# Patient Record
Sex: Female | Born: 1970 | Race: White | Hispanic: Yes | Marital: Married | Smoking: Former smoker
Health system: Southern US, Community
[De-identification: ages and names within clinical notes are randomized; demographics above are authoritative.]

## PROBLEM LIST (undated history)

## (undated) DIAGNOSIS — I10 Essential (primary) hypertension: Secondary | ICD-10-CM

## (undated) HISTORY — PX: ABDOMINAL HYSTERECTOMY: SHX81

## (undated) HISTORY — DX: Essential (primary) hypertension: I10

## (undated) HISTORY — PX: TOTAL ABDOMINAL HYSTERECTOMY: SHX209

---

## 2019-09-01 ENCOUNTER — Encounter: Payer: Self-pay | Admitting: Emergency Medicine

## 2019-09-01 ENCOUNTER — Other Ambulatory Visit: Payer: Self-pay

## 2019-09-01 ENCOUNTER — Ambulatory Visit
Admission: EM | Admit: 2019-09-01 | Discharge: 2019-09-01 | Disposition: A | Discharge status: Home / Self Care | Payer: BC Managed Care – PPO | Attending: Family Medicine | Admitting: Family Medicine

## 2019-09-01 DIAGNOSIS — Z87891 Personal history of nicotine dependence: Secondary | ICD-10-CM | POA: Diagnosis not present

## 2019-09-01 DIAGNOSIS — R0602 Shortness of breath: Secondary | ICD-10-CM | POA: Diagnosis not present

## 2019-09-01 DIAGNOSIS — M542 Cervicalgia: Secondary | ICD-10-CM | POA: Diagnosis not present

## 2019-09-01 DIAGNOSIS — R42 Dizziness and giddiness: Secondary | ICD-10-CM | POA: Diagnosis not present

## 2019-09-01 DIAGNOSIS — Z20822 Contact with and (suspected) exposure to covid-19: Secondary | ICD-10-CM | POA: Insufficient documentation

## 2019-09-01 DIAGNOSIS — R030 Elevated blood-pressure reading, without diagnosis of hypertension: Secondary | ICD-10-CM | POA: Diagnosis not present

## 2019-09-01 DIAGNOSIS — R519 Headache, unspecified: Secondary | ICD-10-CM | POA: Diagnosis not present

## 2019-09-01 NOTE — ED Triage Notes (Signed)
Patient in today c/o dizziness, headache, sob x 3 days. Patient thinks her blood pressure is high and causing these symptoms. Patient had BP checked at work on 08/29/19 and it 174/89. Patient does not have a history of HTN.

## 2019-09-01 NOTE — Discharge Instructions (Addendum)
You need more blood pressure readings to confirm hypertension.  Check your BP at home.  Call [facility] for an appt.  Take care  Dr. Adriana Simas

## 2019-09-01 NOTE — ED Provider Notes (Signed)
[facility]    CSN: [id number] Arrival date & time: 09/01/19  1340      History   Chief Complaint Chief Complaint  Patient presents with  . Dizziness  . Headache  . Shortness of Breath  . elevated blood pressure reading   HPI  49 year old female presents with multiple complaints.  Patient reports intermittent dizziness, headache, neck pain. Also reports "feeling hot". Patient recently checked BP and it was elevated (174/89). No history of hypertension. BP mildly elevated today.  No known inciting factor. No known exacerbating factors.   PMH, Surgical Hx, Family Hx, Social History reviewed and updated as below.  PMH: No significant PMH.  Past Surgical History:  Procedure Laterality Date  . ABDOMINAL HYSTERECTOMY      OB History   No obstetric history on file.      Home Medications    Prior to Admission medications   Medication Sig Start Date End Date Taking? Authorizing Provider  estradiol (ESTRACE) 2 MG tablet Take 2 mg by mouth daily. 08/31/19  Yes [provider]    Family History Family History  Problem Relation Age of Onset  . Sleep apnea Mother   . Hypertension Mother   . Hypertension Father   . Throat cancer Father     Social History Social History   Tobacco Use  . Smoking status: Former Smoker    Quit date: 10/30/2018    Years since quitting: 0.8  . Smokeless tobacco: Never Used  Substance Use Topics  . Alcohol use: Never  . Drug use: Never     Allergies   Morphine and related   Review of Systems Review of Systems Per HPI  Physical Exam Triage Vital Signs ED Triage Vitals  Enc Vitals Group     BP 09/01/19 1406 (!) 141/89     Pulse Rate 09/01/19 1406 75     Resp 09/01/19 1406 18     Temp 09/01/19 1406 98.5 F (36.9 C)     Temp Source 09/01/19 1406 Oral     SpO2 09/01/19 1406 99 %     Weight 09/01/19 1408 170 lb (77.1 kg)     Height 09/01/19 1408 5\' 5"  (1.651 m)     Head Circumference --      Peak Flow --       Pain Score 09/01/19 1406 8     Pain Loc --      Pain Edu? --      Excl. in [place]? --    Updated Vital Signs BP (!) 141/89 (BP Location: Left Arm)   Pulse 75   Temp 98.5 F (36.9 C) (Oral)   Resp 18   Ht 5\' 5"  (1.651 m)   Wt 77.1 kg   SpO2 99%   BMI 28.29 kg/m   Visual Acuity Right Eye Distance:   Left Eye Distance:   Bilateral Distance:    Right Eye Near:   Left Eye Near:    Bilateral Near:     Physical Exam Vitals and nursing note reviewed.  Constitutional:      General: She is not in acute distress.    Appearance: Normal appearance. She is not ill-appearing.  HENT:     Head: Normocephalic and atraumatic.  Eyes:     General:        Right eye: No discharge.        Left eye: No discharge.     Conjunctiva/sclera: Conjunctivae normal.  Cardiovascular:  Rate and Rhythm: Normal rate and regular rhythm.     Heart sounds: No murmur.  Pulmonary:     Effort: Pulmonary effort is normal.     Breath sounds: Normal breath sounds. No wheezing, rhonchi or rales.  Neurological:     Mental Status: She is alert.  Psychiatric:        Behavior: Behavior normal.     Comments: Anxious.     UC Treatments / Results  Labs (all labs ordered are listed, but only abnormal results are displayed) Labs Reviewed  NOVEL CORONAVIRUS, NAA (HOSP ORDER, SEND-OUT TO REF LAB; TAT 18-24 HRS)    EKG   Radiology No results found.  Procedures Procedures (including critical care time)  Medications Ordered in UC Medications - No data to display  Initial Impression / Assessment and Plan / UC Course  I have reviewed the triage vital signs and the nursing notes.  Pertinent labs & imaging results that were available during my care of the patient were reviewed by me and considered in my medical decision making (see chart for details).    49 year old female presents with multiple complaints.  She is well appearing with a normal exam. BP mildly elevated today. Cannot diagnose HTN with  additional BP readings. Advised to see [facility]/Primary care. Check BP at home. Supportive care.   Final Clinical Impressions(s) / UC Diagnoses   Final diagnoses:  Elevated BP without diagnosis of hypertension     Discharge Instructions     You need more blood pressure readings to confirm hypertension.  Check your BP at home.  Call [facility] for an appt.  Take care  Dr. Adriana Simas    ED Prescriptions    None     PDMP not reviewed this encounter.   Tommie Sams, [place] 09/01/19 2229

## 2019-09-03 LAB — NOVEL CORONAVIRUS, NAA (HOSP ORDER, SEND-OUT TO REF LAB; TAT 18-24 HRS): SARS-CoV-2, NAA: NOT DETECTED

## 2019-09-17 ENCOUNTER — Other Ambulatory Visit: Payer: Self-pay

## 2019-09-17 ENCOUNTER — Ambulatory Visit (INDEPENDENT_AMBULATORY_CARE_PROVIDER_SITE_OTHER): Payer: BC Managed Care – PPO | Admitting: Internal Medicine

## 2019-09-17 ENCOUNTER — Encounter: Payer: Self-pay | Admitting: Internal Medicine

## 2019-09-17 VITALS — BP 146/82 | HR 82 | Temp 98.4°F | Ht 65.0 in | Wt 175.0 lb

## 2019-09-17 DIAGNOSIS — I10 Essential (primary) hypertension: Secondary | ICD-10-CM | POA: Diagnosis not present

## 2019-09-17 DIAGNOSIS — Z23 Encounter for immunization: Secondary | ICD-10-CM | POA: Diagnosis not present

## 2019-09-17 DIAGNOSIS — M5431 Sciatica, right side: Secondary | ICD-10-CM | POA: Diagnosis not present

## 2019-09-17 DIAGNOSIS — E538 Deficiency of other specified B group vitamins: Secondary | ICD-10-CM

## 2019-09-17 DIAGNOSIS — E559 Vitamin D deficiency, unspecified: Secondary | ICD-10-CM | POA: Diagnosis not present

## 2019-09-17 DIAGNOSIS — R252 Cramp and spasm: Secondary | ICD-10-CM

## 2019-09-17 MED ORDER — GABAPENTIN 600 MG PO TABS: 600.0000 mg | ORAL_TABLET | Freq: Every day | ORAL | 2 refills | Status: DC

## 2019-09-17 MED ORDER — METAXALONE 800 MG PO TABS: 800.0000 mg | ORAL_TABLET | Freq: Three times a day (TID) | ORAL | 2 refills | Status: DC

## 2019-09-17 MED ORDER — DICLOFENAC SODIUM 75 MG PO TBEC: 75.0000 mg | DELAYED_RELEASE_TABLET | Freq: Two times a day (BID) | ORAL | 2 refills | Status: DC

## 2019-09-17 MED ORDER — LISINOPRIL 10 MG PO TABS: 10.0000 mg | ORAL_TABLET | Freq: Every day | ORAL | 3 refills | Status: DC

## 2019-09-17 NOTE — Progress Notes (Signed)
Date:  09/17/2019   Name:  Susan Durham   DOB:  08/24/1971   MRN:  [id number]  Interpreter services via video link. Chief Complaint: Establish Care (New pt. Flu shot.), Hypertension (Dizziness along with high bp. Brought readings today. ), and Back Pain (Sciatic nerve pain. Right side. Said she has had this for years. Sometimes runs down leg and cannot get up from bed. )  Hypertension This is a new problem. The problem is unchanged. The problem is uncontrolled. Associated symptoms include headaches. Pertinent negatives include no chest pain, orthopnea, palpitations, peripheral edema or shortness of breath. There are no associated agents to hypertension. Past treatments include nothing (did start a low sodium diet).  Back Pain This is a chronic problem. The problem occurs intermittently. The pain is present in the lumbar spine. The pain radiates to the right thigh. The pain is moderate. Associated symptoms include abdominal pain (intermittent upper pain), headaches and weakness (at times in right leg). Pertinent negatives include no chest pain, fever or numbness. She has tried NSAIDs and muscle relaxant (in [place], got relief with Voltaren, Skelaxin and Neurontin) for the symptoms.  Abdominal Cramping The current episode started more than 1 year ago. The problem occurs intermittently. The pain is located in the RUQ. The quality of the pain is cramping. The abdominal pain does not radiate. Associated symptoms include headaches. Pertinent negatives include no constipation, diarrhea, fever, myalgias, nausea or vomiting. Exacerbated by: started while she was pregnant. Relieved by: stretching and massage. She has tried nothing for the symptoms.  She also related a hx of low vitamin D and low B12 when in [place].  She has started taking supplements and her muscle and bone pain seems somewhat improved.   Review of Systems  Constitutional: Negative for chills, fatigue, fever and unexpected weight change.    Respiratory: Negative for shortness of breath and wheezing.   Cardiovascular: Negative for chest pain, palpitations and orthopnea.  Gastrointestinal: Positive for abdominal pain (intermittent upper pain). Negative for blood in stool, constipation, diarrhea, nausea and vomiting.  Musculoskeletal: Positive for back pain. Negative for joint swelling and myalgias.  Neurological: Positive for weakness (at times in right leg) and headaches. Negative for numbness.  Psychiatric/Behavioral: Negative for sleep disturbance.    There are no problems to display for this patient.   Allergies  Allergen Reactions  . Morphine And Related Anaphylaxis    Past Surgical History:  Procedure Laterality Date  . ABDOMINAL HYSTERECTOMY      Social History   Tobacco Use  . Smoking status: Former Smoker    Packs/day: 0.50    Years: 30.00    Pack years: 15.00    Types: Cigarettes    Quit date: 10/30/2018    Years since quitting: 0.8  . Smokeless tobacco: Never Used  Substance Use Topics  . Alcohol use: Never  . Drug use: Never     Medication list has been reviewed and updated.  Current Meds  Medication Sig  . cholecalciferol (VITAMIN D3) 25 MCG (1000 UNIT) tablet Take 1,000 Units by mouth daily.  Marland Kitchen estradiol (ESTRACE) 2 MG tablet Take 2 mg by mouth daily.  . vitamin B-12 (CYANOCOBALAMIN) 500 MCG tablet Take 500 mcg by mouth daily.    PHQ 2/9 Scores 09/17/2019  PHQ - 2 Score 0  PHQ- 9 Score 0    BP Readings from Last 3 Encounters:  09/17/19 (!) 146/82  09/01/19 (!) 141/89    Physical Exam Vitals and nursing note reviewed.  Constitutional:      General: She is not in acute distress.    Appearance: Normal appearance. She is well-developed.  HENT:     Head: Normocephalic and atraumatic.  Cardiovascular:     Rate and Rhythm: Normal rate and regular rhythm.     Pulses: Normal pulses.     Heart sounds: Normal heart sounds. No murmur.  Pulmonary:     Effort: Pulmonary effort is normal.  No respiratory distress.     Breath sounds: Normal breath sounds and air entry. No wheezing or rhonchi.  Musculoskeletal:        General: Normal range of motion.     Cervical back: Normal range of motion.     Lumbar back: Normal. No spasms, tenderness or bony tenderness. Normal range of motion. Negative right straight leg raise test and negative left straight leg raise test.     Right lower leg: No edema.     Left lower leg: No edema.  Lymphadenopathy:     Cervical: No cervical adenopathy.  Skin:    General: Skin is warm and dry.     Capillary Refill: Capillary refill takes less than 2 seconds.     Findings: No rash.  Neurological:     General: No focal deficit present.     Mental Status: She is alert and oriented to person, place, and time.     Sensory: Sensation is intact.     Motor: Motor function is intact.     Gait: Gait normal.     Deep Tendon Reflexes:     Reflex Scores:      Patellar reflexes are 2+ on the right side and 2+ on the left side. Psychiatric:        Attention and Perception: Attention normal.        Mood and Affect: Mood normal.        Behavior: Behavior normal.        Thought Content: Thought content normal.     Wt Readings from Last 3 Encounters:  09/17/19 175 lb (79.4 kg)  09/01/19 170 lb (77.1 kg)    BP (!) 146/82   Pulse 82   Temp 98.4 F (36.9 C) (Oral)   Ht 5\' 5"  (1.651 m)   Wt 175 lb (79.4 kg)   SpO2 97%   BMI 29.12 kg/m   Assessment and Plan: 1. Essential hypertension Needs to begin medication - Continue low salt diet, monitor BP at home twice a week Follow up in 6 weeks - lisinopril (ZESTRIL) 10 MG tablet; Take 1 tablet (10 mg total) by mouth daily.  Dispense: 90 tablet; Refill: 3 - CBC with Differential/Platelet - Comprehensive metabolic panel - TSH + free T4  2. Sciatica, right side Will resume previous regimen that provided benefit - diclofenac (VOLTAREN) 75 MG EC tablet; Take 1 tablet (75 mg total) by mouth 2 (two) times  daily.  Dispense: 60 tablet; Refill: 2 - gabapentin (NEURONTIN) 600 MG tablet; Take 1 tablet (600 mg total) by mouth at bedtime.  Dispense: 30 tablet; Refill: 2 - metaxalone (SKELAXIN) 800 MG tablet; Take 1 tablet (800 mg total) by mouth 3 (three) times daily.  Dispense: 90 tablet; Refill: 2  3. Vitamin D deficiency Supplemented; check levels - VITAMIN D 25 Hydroxy (Vit-D Deficiency, Fractures)  4. Vitamin B12 deficiency Supplemented, check levels - Vitamin B12  5. Muscle cramps In legs and abdomen intermittently Gabapentin may be helpful - Magnesium   Partially dictated using . Any errors are  unintentional.  Halina Maidens, MD [facility]  09/17/2019

## 2019-09-18 LAB — CBC WITH DIFFERENTIAL/PLATELET
Basophils Absolute: 0 10*3/uL (ref 0.0–0.2)
Basos: 1 %
EOS (ABSOLUTE): 0.2 10*3/uL (ref 0.0–0.4)
Eos: 3 %
Hematocrit: 38.7 % (ref 34.0–46.6)
Hemoglobin: 12.4 g/dL (ref 11.1–15.9)
Immature Grans (Abs): 0 10*3/uL (ref 0.0–0.1)
Immature Granulocytes: 0 %
Lymphocytes Absolute: 2 10*3/uL (ref 0.7–3.1)
Lymphs: 30 %
MCH: 28.4 pg (ref 26.6–33.0)
MCHC: 32 g/dL (ref 31.5–35.7)
MCV: 89 fL (ref 79–97)
Monocytes Absolute: 0.8 10*3/uL (ref 0.1–0.9)
Monocytes: 12 %
Neutrophils Absolute: 3.8 10*3/uL (ref 1.4–7.0)
Neutrophils: 54 %
Platelets: 267 10*3/uL (ref 150–450)
RBC: 4.36 x10E6/uL (ref 3.77–5.28)
RDW: 11.6 % — ABNORMAL LOW (ref 11.7–15.4)
WBC: 6.9 10*3/uL (ref 3.4–10.8)

## 2019-09-18 LAB — COMPREHENSIVE METABOLIC PANEL
ALT: 26 IU/L (ref 0–32)
AST: 22 IU/L (ref 0–40)
Albumin/Globulin Ratio: 1.3 (ref 1.2–2.2)
Albumin: 4.2 g/dL (ref 3.8–4.8)
Alkaline Phosphatase: 54 IU/L (ref 39–117)
BUN/Creatinine Ratio: 17 (ref 9–23)
BUN: 15 mg/dL (ref 6–24)
Bilirubin Total: <0.2 mg/dL (ref 0.0–1.2)
CO2: 23 mmol/L (ref 20–29)
Calcium: 9.7 mg/dL (ref 8.7–10.2)
Chloride: 101 mmol/L (ref 96–106)
Creatinine, Ser: 0.9 mg/dL (ref 0.57–1.00)
GFR calc Af Amer: 87 mL/min/{1.73_m2} (ref >59)
GFR calc non Af Amer: 75 mL/min/{1.73_m2} (ref >59)
Globulin, Total: 3.2 g/dL (ref 1.5–4.5)
Glucose: 91 mg/dL (ref 65–99)
Potassium: 4.6 mmol/L (ref 3.5–5.2)
Sodium: 136 mmol/L (ref 134–144)
Total Protein: 7.4 g/dL (ref 6.0–8.5)

## 2019-09-18 LAB — VITAMIN D 25 HYDROXY (VIT D DEFICIENCY, FRACTURES): Vit D, 25-Hydroxy: 22.1 ng/mL — ABNORMAL LOW (ref 30.0–100.0)

## 2019-09-18 LAB — TSH+FREE T4
Free T4: 0.93 ng/dL (ref 0.82–1.77)
TSH: 3.74 u[IU]/mL (ref 0.450–4.500)

## 2019-09-18 LAB — MAGNESIUM: Magnesium: 2 mg/dL (ref 1.6–2.3)

## 2019-09-18 LAB — VITAMIN B12: Vitamin B-12: 533 pg/mL (ref 232–1245)

## 2020-03-16 ENCOUNTER — Ambulatory Visit: Payer: BC Managed Care – PPO | Admitting: Internal Medicine

## 2020-07-07 ENCOUNTER — Ambulatory Visit
Admission: RE | Admit: 2020-07-07 | Discharge: 2020-07-07 | Disposition: A | Discharge status: Home / Self Care | Payer: BC Managed Care – PPO | Source: Ambulatory Visit | Attending: Internal Medicine | Admitting: Internal Medicine

## 2020-07-07 ENCOUNTER — Ambulatory Visit
Admission: RE | Admit: 2020-07-07 | Discharge: 2020-07-07 | Disposition: A | Discharge status: Home / Self Care | Payer: BC Managed Care – PPO | Attending: Internal Medicine | Admitting: Internal Medicine

## 2020-07-07 ENCOUNTER — Other Ambulatory Visit: Payer: Self-pay

## 2020-07-07 ENCOUNTER — Encounter: Payer: Self-pay | Admitting: Internal Medicine

## 2020-07-07 ENCOUNTER — Ambulatory Visit (INDEPENDENT_AMBULATORY_CARE_PROVIDER_SITE_OTHER): Payer: BC Managed Care – PPO | Admitting: Internal Medicine

## 2020-07-07 VITALS — BP 130/80 | HR 73 | Temp 98.5°F | Ht 65.0 in | Wt 176.0 lb

## 2020-07-07 DIAGNOSIS — J4520 Mild intermittent asthma, uncomplicated: Secondary | ICD-10-CM | POA: Insufficient documentation

## 2020-07-07 DIAGNOSIS — M5431 Sciatica, right side: Secondary | ICD-10-CM

## 2020-07-07 DIAGNOSIS — Z23 Encounter for immunization: Secondary | ICD-10-CM | POA: Diagnosis not present

## 2020-07-07 DIAGNOSIS — Z1231 Encounter for screening mammogram for malignant neoplasm of breast: Secondary | ICD-10-CM

## 2020-07-07 DIAGNOSIS — I10 Essential (primary) hypertension: Secondary | ICD-10-CM | POA: Diagnosis not present

## 2020-07-07 DIAGNOSIS — G5601 Carpal tunnel syndrome, right upper limb: Secondary | ICD-10-CM | POA: Diagnosis not present

## 2020-07-07 MED ORDER — GABAPENTIN 600 MG PO TABS: 600.0000 mg | ORAL_TABLET | Freq: Every day | ORAL | 2 refills | Status: DC

## 2020-07-07 MED ORDER — PREDNISONE 10 MG PO TABS: ORAL_TABLET | ORAL | 0 refills | Status: AC

## 2020-07-07 MED ORDER — ALBUTEROL SULFATE HFA 108 (90 BASE) MCG/ACT IN AERS: 2.0000 | INHALATION_SPRAY | Freq: Four times a day (QID) | RESPIRATORY_TRACT | 0 refills | Status: DC | PRN

## 2020-07-07 NOTE — Progress Notes (Signed)
Date:  07/07/2020   Name:  Susan Durham   DOB:  Jan 19, 1971   MRN:  [id number]   Chief Complaint: Back Pain (X1 month, lower back pain, sciatic, constant pain for the past month, pain shoots down right left and right arm ) From visit 08/2019: Back Pain This is a chronic problem. The problem occurs intermittently. The pain is present in the lumbar spine. The pain radiates to the right thigh. The pain is moderate. Associated symptoms include abdominal pain (intermittent upper pain), headaches and weakness (at times in right leg). Pertinent negatives include no chest pain, fever or numbness. She has tried NSAIDs and muscle relaxant (in [place], got relief with Voltaren, Skelaxin and Neurontin) for the symptoms. These medications were refilled at that visit. Update: She is taking voltaren.  She is not taking gabapentin or Skelaxin.  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  She has had to miss some work because of the pain in her back radiating down her right leg.  Now it is going all the way to her ankle and heel.  She denies weakness or numbness.  She changed to a sitting job briefly and this helped relieve the pain but it was still constant.   Hypertension This is a new problem. The current episode started more than 1 month ago. Pertinent negatives include no chest pain, headaches, palpitations or shortness of breath. Past treatments include ACE inhibitors (prescribed in Jan but pt did not return for follow up).  Asthma She complains of wheezing. There is no cough or shortness of breath. This is a recurrent problem. Episode onset: year ago. The problem occurs intermittently. The problem has been unchanged. Associated symptoms include myalgias. Pertinent negatives include no chest pain, fever or headaches. Her symptoms are aggravated by exposure to smoke (previous smoker - quit last year). Her symptoms are alleviated by beta-agonist. Her past medical history is significant for asthma.  Hand  numbness - she gets tingling in her right hand and fingers intermittently.  It is often worse in the early morning.  She has no weakness.  No neck pain, no trauma.  Lab Results  Component Value Date   CREATININE 0.90 09/17/2019   BUN 15 09/17/2019   NA 136 09/17/2019   K 4.6 09/17/2019   CL 101 09/17/2019   CO2 23 09/17/2019   No results found for: CHOL, HDL, LDLCALC, LDLDIRECT, TRIG, CHOLHDL Lab Results  Component Value Date   TSH 3.740 09/17/2019   No results found for: HGBA1C Lab Results  Component Value Date   WBC 6.9 09/17/2019   HGB 12.4 09/17/2019   HCT 38.7 09/17/2019   MCV 89 09/17/2019   PLT 267 09/17/2019   Lab Results  Component Value Date   ALT 26 09/17/2019   AST 22 09/17/2019   ALKPHOS 54 09/17/2019   BILITOT <0.2 09/17/2019     Review of Systems  Constitutional: Negative for chills, fatigue, fever and unexpected weight change.  Respiratory: Positive for chest tightness and wheezing. Negative for cough and shortness of breath.   Cardiovascular: Negative for chest pain, palpitations and leg swelling.  Gastrointestinal: Negative for abdominal pain.  Musculoskeletal: Positive for arthralgias, back pain and myalgias.  Neurological: Negative for dizziness, light-headedness and headaches.    Patient Active Problem List   Diagnosis Date Noted  . Essential hypertension 07/07/2020  . Sciatica, right side 07/07/2020    Allergies  Allergen Reactions  . Morphine And Related Anaphylaxis  Past Surgical History:  Procedure Laterality Date  . ABDOMINAL HYSTERECTOMY      Social History   Tobacco Use  . Smoking status: Former Smoker    Packs/day: 0.50    Years: 30.00    Pack years: 15.00    Types: Cigarettes    Quit date: 10/30/2018    Years since quitting: 1.6  . Smokeless tobacco: Never Used  Vaping Use  . Vaping Use: Never used  Substance Use Topics  . Alcohol use: Never  . Drug use: Never     Medication list has been reviewed and  updated.  Current Meds  Medication Sig  . cholecalciferol (VITAMIN D3) 25 MCG (1000 UNIT) tablet Take 1,000 Units by mouth daily.  . diclofenac (VOLTAREN) 75 MG EC tablet Take 1 tablet (75 mg total) by mouth 2 (two) times daily.  Marland Kitchen estradiol (ESTRACE) 2 MG tablet Take 2 mg by mouth daily.  Marland Kitchen lisinopril (ZESTRIL) 10 MG tablet Take 1 tablet (10 mg total) by mouth daily.  . vitamin B-12 (CYANOCOBALAMIN) 500 MCG tablet Take 500 mcg by mouth daily.  . [DISCONTINUED] gabapentin (NEURONTIN) 600 MG tablet Take 1 tablet (600 mg total) by mouth at bedtime.    PHQ 2/9 Scores 07/07/2020 09/17/2019  PHQ - 2 Score 0 0  PHQ- 9 Score 0 0    GAD 7 : Generalized Anxiety Score 07/07/2020  Nervous, Anxious, on Edge 0  Control/stop worrying 0  Worry too much - different things 0  Trouble relaxing 0  Restless 0  Easily annoyed or irritable 0  Afraid - awful might happen 0  Total GAD 7 Score 0    BP Readings from Last 3 Encounters:  07/07/20 130/80  09/17/19 (!) 146/82  09/01/19 (!) 141/89    Physical Exam Vitals and nursing note reviewed.  Constitutional:      General: She is not in acute distress.    Appearance: She is well-developed.  HENT:     Head: Normocephalic and atraumatic.  Neck:     Vascular: No carotid bruit.  Cardiovascular:     Rate and Rhythm: Normal rate and regular rhythm.     Pulses: Normal pulses.     Heart sounds: No murmur heard.   Pulmonary:     Effort: Pulmonary effort is normal. No respiratory distress.     Breath sounds: No wheezing or rhonchi.  Musculoskeletal:     Right wrist: No swelling, effusion or tenderness. Normal range of motion.     Left wrist: No swelling, effusion or tenderness. Normal range of motion.     Cervical back: Normal and normal range of motion. No spasms or tenderness. Normal range of motion.     Lumbar back: Tenderness present. Negative right straight leg raise test and negative left straight leg raise test.     Right lower leg: No edema.      Left lower leg: No edema.     Comments: Phalen's sign + on right. Tinel's sign -  Lymphadenopathy:     Cervical: No cervical adenopathy.  Skin:    General: Skin is warm and dry.     Findings: No rash.  Neurological:     Mental Status: She is alert and oriented to person, place, and time.     Motor: Motor function is intact.     Deep Tendon Reflexes:     Reflex Scores:      Bicep reflexes are 2+ on the right side and 2+ on the left side.  Patellar reflexes are 2+ on the right side and 2+ on the left side. Psychiatric:        Behavior: Behavior normal.        Thought Content: Thought content normal.     Wt Readings from Last 3 Encounters:  07/07/20 176 lb (79.8 kg)  09/17/19 175 lb (79.4 kg)  09/01/19 170 lb (77.1 kg)    BP 130/80   Pulse 73   Temp 98.5 F (36.9 C) (Oral)   Ht 5\' 5"  (1.651 m)   Wt 176 lb (79.8 kg)   SpO2 97%   BMI 29.29 kg/m   Assessment and Plan: 1. Sciatica, right side Will give steroid taper and resume gabapentin Obtain Xrays; consider referral to Ortho or pain managment - DG Lumbar Spine Complete; Future - predniSONE (DELTASONE) 10 MG tablet; Take 6 on day 1and 2, 5 on day 3 and 4, 4 on day 5 and 6 , 3 on day 7 and 8, 2 on day 9 and 10 and 1 on day 11 and 12 then stop.  Dispense: 42 tablet; Refill: 0 - gabapentin (NEURONTIN) 600 MG tablet; Take 1 tablet (600 mg total) by mouth at bedtime.  Dispense: 30 tablet; Refill: 2  2. Essential hypertension Clinically stable exam with well controlled BP on lisinopril. Tolerating medications without side effects at this time. Pt to continue current regimen and low sodium diet; benefits of regular exercise as able discussed.  3. Carpal tunnel syndrome on right Recommend wrist splints to be worn while sleeping  4. Encounter for screening mammogram for breast cancer Pt can schedule at [facility] - MM 3D SCREEN BREAST BILATERAL; Future  5. Mild intermittent asthma without complication Pt complains of  intermittent SOB and mild wheezing No currently on any treatment Recommend Albuterol PRN - albuterol (VENTOLIN HFA) 108 (90 Base) MCG/ACT inhaler; Inhale 2 puffs into the lungs every 6 (six) hours as needed for wheezing or shortness of breath.  Dispense: 1 each; Refill: 0  6. Need for immunization against influenza - Flu Vaccine QUAD 36+ mos IM   Partially dictated using [facility]. Any errors are unintentional.  Animal nutritionist, MD [facility]  07/07/2020

## 2020-07-07 NOTE — Patient Instructions (Addendum)
Wear a splint while sleeping on the right wrist.  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 El tnel carpiano es un rea estrecha ubicada en el lado palmar de la [place]. Los movimientos repetidos de la mueca o determinadas enfermedades pueden causar la hinchazn del tnel. Esta hinchazn comprime el nervio principal de la mueca (nervio mediano). Cules son las causas? Esta afeccin puede ser causada por lo siguiente:  Movimientos repetidos de [employer]  Lesiones en la [employer].  Artritis.  Un quiste o un tumor en el tnel carpiano.  Acumulacin de lquido Academic librarian. A veces, se desconoce la causa de esta afeccin. Qu incrementa el riesgo? Los siguientes factores pueden hacer que sea ms propenso a Clinical cytogeneticist afeccin:  Tener un trabajo que requiera mover repetidamente la [place] del mismo modo, por ejemplo, de carnicero o cajero.  Ser mujer.  Tener ciertas afecciones, tales como: ? Diabetes. ? Obesidad. ? Tiroidea hipoactiva (hipotiroidismo). ? Insuficiencia renal. Cules son los signos o sntomas? Los sntomas de esta afeccin incluyen:  Sensacin de hormigueo en los dedos de la mano, especialmente el pulgar, el ndice y el dedo [place].  Hormigueo o adormecimiento en la mano.  Sensacin de Engineer, mining en todo el brazo, especialmente cuando la [place] y el codo estn flexionados durante [place].  Dolor en la mueca que sube por el brazo hasta el hombro.  Dolor que baja hasta la palma de la mano o los dedos.  Sensacin de [employer]. Tal vez tenga dificultad para tomar y Personal assistant. Los sntomas pueden empeorar durante la noche. Cmo se diagnostica? Esta afeccin se diagnostica mediante los antecedentes mdicos y un examen fsico. Tambin pueden hacerle estudios, que incluyen los siguientes:  Physiological scientist electromiograma (EMG). Esta prueba mide las  seales elctricas que los nervios les envan a los msculos.  Estudio de [employer]. Este estudio permite determinar si las seales elctricas pasan correctamente por los nervios.  Estudios de diagnstico por imgenes, como radiografas, una ecografa y Hotel manager (RM). Estos estudios Education officer, community las posibles causas de la afeccin. Cmo se trata? El tratamiento de esta afeccin puede incluir:  Cambios en el estilo de vida. Es importante que deje o cambie la actividad que caus la afeccin.  Hacer ejercicio y actividades para fortalecer los msculos y los huesos (fisioterapia).  Aprender a usar la mano nuevamente despus del diagnstico (terapia ocupacional).  Analgsicos y antiinflamatorios. Esto puede incluir medicamentos que se inyectan en la [place].  Una frula para la [place].  [place]. Siga estas indicaciones en su casa: Si tiene una frula:  Use la frula como se lo haya indicado el mdico. Qutesela solamente como se lo haya indicado el mdico.  Afloje la frula si los dedos se le adormecen, siente hormigueo o se le enfran y se tornan de Research officer, trade union.  Mantenga la frula limpia.  Si la frula no es impermeable: ? No deje que se moje. ? Cbrala con un envoltorio hermtico cuando tome un bao de inmersin o [place]. Control del dolor, la rigidez y la hinchazn   Si se lo indican, aplique hielo sobre la zona del dolor: ? Si tiene una frula desmontable, qutesela como se lo haya indicado el mdico. ? Ponga el hielo en una bolsa plstica. ? Coloque una [employer] piel y Copy. ? Coloque el hielo durante , 2a3veces al da.  Indicaciones generales  [employer] de venta libre y los recetados solamente como se lo haya indicado el mdico.  Descanse la [place] de toda actividad que le cause dolor. Si la afeccin tiene relacin con Kathie Dike, hable con su empleador [employer] pueden [place], por  [place], usar una almohadilla para apoyar la mueca mientras tipea.  Haga los ejercicios como se lo hayan indicado el mdico, el fisioterapeuta o el terapeuta ocupacional.  Concurra a todas las visitas de seguimiento como se lo haya indicado el mdico. Esto es importante. Comunquese con un mdico si:  Aparecen nuevos sntomas.  El dolor no se alivia con los [employer].  Sus sntomas empeoran. Solicite ayuda de inmediato si:  Tiene hormigueo o adormecimiento intensos en la mueca o la mano. Resumen  El sndrome del tnel carpiano es una afeccin que causa dolor en la mano y en el brazo.  Generalmente se debe a movimientos repetidos de [employer].  El sndrome del tnel carpiano se trata mediante cambios en el estilo de vida y medicamentos. Tambin puede indicarse la [place].  Siga las indicaciones del mdico sobre el uso de una frula, el descanso de la [place], la asistencia a las visitas de seguimiento y Freight forwarder para pedir ayuda. Esta informacin no tiene Theme park manager el consejo del mdico. Asegrese de hacerle al mdico cualquier pregunta que tenga. Document Revised: 01/18/2018 Document Reviewed: 01/18/2018 Elsevier Patient Education  2020 [employer].

## 2020-07-10 ENCOUNTER — Other Ambulatory Visit: Payer: Self-pay | Admitting: Internal Medicine

## 2020-07-10 DIAGNOSIS — M5431 Sciatica, right side: Secondary | ICD-10-CM

## 2020-07-10 NOTE — Progress Notes (Signed)
Pt wants RF to ortho.  KP

## 2020-08-05 ENCOUNTER — Inpatient Hospital Stay: Admission: RE | Admit: 2020-08-05 | Payer: BC Managed Care – PPO | Source: Ambulatory Visit

## 2020-08-17 ENCOUNTER — Other Ambulatory Visit: Payer: Self-pay | Admitting: Internal Medicine

## 2020-08-17 ENCOUNTER — Ambulatory Visit: Payer: BC Managed Care – PPO | Admitting: Internal Medicine

## 2020-08-17 DIAGNOSIS — I10 Essential (primary) hypertension: Secondary | ICD-10-CM

## 2020-08-18 ENCOUNTER — Ambulatory Visit: Payer: BC Managed Care – PPO | Admitting: Internal Medicine

## 2020-09-01 ENCOUNTER — Ambulatory Visit: Payer: Self-pay | Admitting: *Deleted

## 2020-09-01 NOTE — Telephone Encounter (Signed)
Caller used interpreter Byrd Hesselbach  [phone]. C/o covid symptoms since 08/30/20 Sunday night. Patient took Pfizer booster on 08/30/20 and found out her nephew tested positive for covid. Nephew was tested on 08/29/20 but patient did not know nephew was positive for covid. Patient does not know if she has symptoms from booster or if she has covid from nephew. Patient was around nephew Thursday, Friday, Saturday 08/28/20. C/o cough and could not sleep 08/30/20 Sunday night. Woke up and felt worse on Monday. Dry cough, headache, runny nose, fever, some SOB. Patient is concerned due to her hx HTN and asthma. Patient was tested for covid today and awaiting results. Patient reports she needs a note for work while she is out. Requesting note/ letter to be sent via email: [email or web address]. patient encouraged to go to [facility] or ED if symptoms worsen. Patient is requesting medication for cough. Encouraged patient to call pharmacist to review OTC for treating symptoms due to HTN. Care advise given. Patient verbalized understanding of care advise and to call back or go to [facility] or ED if symptoms worsen. Reviewed quarantine guidelines until patient finds out if she is positive for covid.   Reason for Disposition . HIGH RISK for severe COVID complications (e.g., age > 64 years, obesity with BMI > 25, pregnant, chronic lung disease or other chronic medical condition)  (Exception: Already seen by PCP and no new or worsening symptoms.)  Answer Assessment - Initial Assessment Questions 1. COVID-19 EXPOSURE: "Please describe how you were exposed to someone with a COVID-19 infection."     Exposed to nephew who tested positive on 08/29/20 2. PLACE of CONTACT: "Where were you when you were exposed to COVID-19?" (e.g., home, school, medical waiting room; which city?)     Home  3. TYPE of CONTACT: "How much contact was there?" (e.g., sitting next to, live in same house, work in same office, same building)     In the same house 4. DURATION  of CONTACT: "How long were you in contact with the COVID-19 patient?" (e.g., a few seconds, passed by person, a few minutes, 15 minutes or longer, live with the patient)     Longer than 15 minutes 5. MASK: "Were you wearing a mask?" "Was the other person wearing a mask?" Note: wearing a mask reduces the risk of an otherwise close contact.     No mask  6. DATE of CONTACT: "When did you have contact with a COVID-19 patient?" (e.g., how many days ago)     Thurdsay Friday and Saturday 08/29/20 7. COMMUNITY SPREAD: "Are there lots of cases of COVID-19 (community spread) where you live?" (See public health department website, if unsure)       na 8. SYMPTOMS: "Do you have any symptoms?" (e.g., fever, cough, breathing difficulty, loss of taste or smell)     Fever, cough, headache, congestion, sore throat, runny nose  9. VACCINE: "Have you gotten the COVID-19 vaccine?" If Yes ask: "Which one, how many shots, when did you get it?"     Pfizer booster on 08/30/20 10. PREGNANCY OR POSTPARTUM: "Is there any chance you are pregnant?" "When was your last menstrual period?" "Did you deliver in the last 2 weeks?"       na 11. HIGH RISK: "Do you have any heart or lung problems?" "Do you have a weak immune system?" (e.g., heart failure, COPD, asthma, HIV positive, chemotherapy, renal failure, diabetes mellitus, sickle cell anemia, obesity)       Asthma, HTN 12. TRAVEL: "Have you  traveled out of the country recently?" If Yes, ask: "When and where?" Also ask about out-of-state travel, since the CDC has identified some high-risk cities for community spread in the [place]. Note: Travel becomes less relevant if there is widespread community transmission where the patient lives.       No . Only drove from [place]  Answer Assessment - Initial Assessment Questions 1. COVID-19 DIAGNOSIS: "Who made your COVID-19 diagnosis?" "Was it confirmed by a positive lab test?" If not diagnosed by a HCP, ask "Are there lots of cases (community  spread) where you live?" Note: See public health department website, if unsure.     Tested for covid today  2. COVID-19 EXPOSURE: "Was there any known exposure to COVID before the symptoms began?" CDC Definition of close contact: within 6 feet (2 meters) for a total of 15 minutes or more over a 24-hour period.      Yes . Nephew. Patient received Pfizer booster on 08/30/20 and developed symptoms Sunday night  3. ONSET: "When did the COVID-19 symptoms start?"      08/30/20 Sunday night  4. WORST SYMPTOM: "What is your worst symptom?" (e.g., cough, fever, shortness of breath, muscle aches)      Fever cough, headache, runny nose, sore throat, SOB at times 5. COUGH: "Do you have a cough?" If Yes, ask: "How bad is the cough?"       Cough, dry cough bad at times 6. FEVER: "Do you have a fever?" If Yes, ask: "What is your temperature, how was it measured, and when did it start?"     Fever  7. RESPIRATORY STATUS: "Describe your breathing?" (e.g., shortness of breath, wheezing, unable to speak)      Some SOB 8. BETTER-SAME-WORSE: "Are you getting better, staying the same or getting worse compared to yesterday?"  If getting worse, ask, "In what way?"     Worse today 9. HIGH RISK DISEASE: "Do you have any chronic medical problems?" (e.g., asthma, heart or lung disease, weak immune system, obesity, etc.)     Asthma, HTN 10. VACCINE: "Have you gotten the COVID-19 vaccine?" If Yes ask: "Which one, how many shots, when did you get it?"       Pfizer booster received on 08/30/20 11. PREGNANCY: "Is there any chance you are pregnant?" "When was your last menstrual period?"       na 12. OTHER SYMPTOMS: "Do you have any other symptoms?"  (e.g., chills, fatigue, headache, loss of smell or taste, muscle pain, sore throat; new loss of smell or taste especially support the diagnosis of COVID-19)       Headache, sore throat, dry cough, fever, runny nose  Protocols used: CORONAVIRUS (COVID-19) DIAGNOSED OR SUSPECTED-A-AH,  CORONAVIRUS (COVID-19) EXPOSURE-A-AH

## 2020-09-02 NOTE — Telephone Encounter (Signed)
Spoke to pt explained to her that we could not give her a note for work. Told pt to reach out to employer and send them all documents that she received when getting her Covid test. Also told pt to give her employer a copy of the negative/positive results. Pt verbalized understanding.  KP

## 2020-09-15 ENCOUNTER — Telehealth: Payer: Self-pay

## 2020-09-15 NOTE — Telephone Encounter (Signed)
Called pt left VM to call back. Dr. Judithann Graves is out of the office for a few days. We do not take pts out of work. If pt symptoms is getting worse please go to UC.   Will route result note to [facility] Nurse Triage for follow up when patient returns call to clinic. Nurse may give results to patient if they return call. CRM created for this message.   KP

## 2020-09-15 NOTE — Telephone Encounter (Unsigned)
Copied from CRM [id number]. Topic: Appointment Scheduling - Scheduling Inquiry for Clinic >> Sep 15, 2020  8:43 AM Susan Durham E wrote: Reason for CRM: Pt called to schedule an appt for her husband Susan Durham) and herself due to still having covid symptoms/ Pt is to return to work today but is still experiencing body aches and a cough/ her husband is experiencing the same / please advise

## 2020-09-30 ENCOUNTER — Ambulatory Visit: Payer: BC Managed Care – PPO | Admitting: Internal Medicine

## 2020-10-07 DIAGNOSIS — Z90710 Acquired absence of both cervix and uterus: Secondary | ICD-10-CM | POA: Insufficient documentation

## 2020-10-11 ENCOUNTER — Other Ambulatory Visit: Payer: Self-pay | Admitting: Internal Medicine

## 2020-10-11 DIAGNOSIS — J4520 Mild intermittent asthma, uncomplicated: Secondary | ICD-10-CM

## 2020-11-10 ENCOUNTER — Other Ambulatory Visit: Payer: Self-pay | Admitting: Internal Medicine

## 2020-11-10 DIAGNOSIS — J4520 Mild intermittent asthma, uncomplicated: Secondary | ICD-10-CM

## 2020-12-03 ENCOUNTER — Other Ambulatory Visit: Payer: Self-pay | Admitting: Internal Medicine

## 2020-12-03 DIAGNOSIS — I10 Essential (primary) hypertension: Secondary | ICD-10-CM

## 2021-02-17 ENCOUNTER — Ambulatory Visit (INDEPENDENT_AMBULATORY_CARE_PROVIDER_SITE_OTHER): Payer: BC Managed Care – PPO | Admitting: Internal Medicine

## 2021-02-17 ENCOUNTER — Other Ambulatory Visit: Payer: Self-pay

## 2021-02-17 ENCOUNTER — Encounter: Payer: Self-pay | Admitting: Internal Medicine

## 2021-02-17 VITALS — BP 148/94 | HR 78 | Temp 98.1°F | Ht 65.0 in | Wt 179.0 lb

## 2021-02-17 DIAGNOSIS — M2550 Pain in unspecified joint: Secondary | ICD-10-CM | POA: Diagnosis not present

## 2021-02-17 DIAGNOSIS — Z23 Encounter for immunization: Secondary | ICD-10-CM | POA: Diagnosis not present

## 2021-02-17 DIAGNOSIS — I10 Essential (primary) hypertension: Secondary | ICD-10-CM

## 2021-02-17 DIAGNOSIS — N951 Menopausal and female climacteric states: Secondary | ICD-10-CM

## 2021-02-17 DIAGNOSIS — Z1231 Encounter for screening mammogram for malignant neoplasm of breast: Secondary | ICD-10-CM

## 2021-02-17 DIAGNOSIS — M5431 Sciatica, right side: Secondary | ICD-10-CM | POA: Diagnosis not present

## 2021-02-17 DIAGNOSIS — J4520 Mild intermittent asthma, uncomplicated: Secondary | ICD-10-CM | POA: Diagnosis not present

## 2021-02-17 DIAGNOSIS — Z1211 Encounter for screening for malignant neoplasm of colon: Secondary | ICD-10-CM

## 2021-02-17 DIAGNOSIS — M21611 Bunion of right foot: Secondary | ICD-10-CM

## 2021-02-17 MED ORDER — ESTRADIOL 2 MG PO TABS: 2.0000 mg | ORAL_TABLET | Freq: Every day | ORAL | 1 refills | Status: DC

## 2021-02-17 MED ORDER — MELOXICAM 15 MG PO TABS: 15.0000 mg | ORAL_TABLET | Freq: Every day | ORAL | 0 refills | Status: DC

## 2021-02-17 MED ORDER — LISINOPRIL 20 MG PO TABS: 20.0000 mg | ORAL_TABLET | Freq: Every day | ORAL | 3 refills | Status: DC

## 2021-02-17 MED ORDER — ALBUTEROL SULFATE HFA 108 (90 BASE) MCG/ACT IN AERS: INHALATION_SPRAY | RESPIRATORY_TRACT | 1 refills | Status: DC

## 2021-02-17 NOTE — Progress Notes (Signed)
Date:  02/17/2021   Name:  Susan Durham   DOB:  1970-09-12   MRN:  [id number]   Chief Complaint: Hypertension (Used Stratus video interpreter ID #[id number].  )  Hypertension This is a chronic problem. The problem is controlled. Pertinent negatives include no chest pain, headaches, neck pain, palpitations or shortness of breath. Past treatments include ACE inhibitors. The current treatment provides significant improvement. There is no history of kidney disease, CAD/MI or CVA.  Back Pain Pertinent negatives include no abdominal pain, chest pain, headaches, pelvic pain or weakness.  Menopause syndrome - has been taking estrogen with good control of sx.  When she runs out of meds, she has hot flashes and sweats. Joint pains - multiple joints - hands, ankles, hands every day. No redness or swelling.  No fam hx of SLE or RA. Takes Advil 800 mg once a day with significant benefit.  She is also due for mammogram, colonoscopy and wants Shingrix vaccines. Lab Results  Component Value Date   CREATININE 0.90 09/17/2019   BUN 15 09/17/2019   NA 136 09/17/2019   K 4.6 09/17/2019   CL 101 09/17/2019   CO2 23 09/17/2019   No results found for: CHOL, HDL, LDLCALC, LDLDIRECT, TRIG, CHOLHDL Lab Results  Component Value Date   TSH 3.740 09/17/2019   No results found for: HGBA1C Lab Results  Component Value Date   WBC 6.9 09/17/2019   HGB 12.4 09/17/2019   HCT 38.7 09/17/2019   MCV 89 09/17/2019   PLT 267 09/17/2019   Lab Results  Component Value Date   ALT 26 09/17/2019   AST 22 09/17/2019   ALKPHOS 54 09/17/2019   BILITOT <0.2 09/17/2019     Review of Systems  Constitutional:  Negative for fatigue and unexpected weight change.  HENT:  Negative for nosebleeds.   Eyes:  Negative for visual disturbance.  Respiratory:  Negative for cough, chest tightness, shortness of breath and wheezing.   Cardiovascular:  Negative for chest pain, palpitations and leg swelling.  Gastrointestinal:   Negative for abdominal pain, constipation and diarrhea.  Genitourinary:  Negative for menstrual problem, pelvic pain and urgency.       Hot flashes and sweats well controlled with Estrace  Musculoskeletal:  Positive for arthralgias and back pain. Negative for joint swelling, myalgias and neck pain.  Allergic/Immunologic: Negative for environmental allergies.  Neurological:  Negative for dizziness, weakness, light-headedness and headaches.  Hematological:  Negative for adenopathy.  Psychiatric/Behavioral:  Negative for dysphoric mood and sleep disturbance. The patient is not nervous/anxious.    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   Essential hypertension 07/07/2020   Sciatica, right side 07/07/2020   Carpal tunnel syndrome on right 07/07/2020   Mild intermittent asthma without complication 07/07/2020    Allergies  Allergen Reactions   Morphine And Related Anaphylaxis    Past Surgical History:  Procedure Laterality Date   ABDOMINAL HYSTERECTOMY      Social History   Tobacco Use   Smoking status: Former    Packs/day: 0.50    Years: 30.00    Pack years: 15.00    Types: Cigarettes    Quit date: 10/30/2018    Years since quitting: 2.3   Smokeless tobacco: Never  Vaping Use   Vaping Use: Never used  Substance Use Topics   Alcohol use: Never   Drug use: Never     Medication list has been reviewed and updated.  Current Meds  Medication Sig   lisinopril (ZESTRIL) 20 MG tablet Take 1 tablet (20 mg total) by mouth daily.   meloxicam (MOBIC) 15 MG tablet Take 1 tablet (15 mg total) by mouth daily.   vitamin B-12 (CYANOCOBALAMIN) 500 MCG tablet Take 500 mcg by mouth daily.   [DISCONTINUED] albuterol (VENTOLIN HFA) 108 (90 Base) MCG/ACT inhaler TAKE 2 PUFFS BY MOUTH EVERY 6 HOURS AS NEEDED FOR WHEEZE OR SHORTNESS OF BREATH   [DISCONTINUED] estradiol (ESTRACE) 2 MG tablet Take 2 mg by mouth daily.   [DISCONTINUED] lisinopril  (ZESTRIL) 10 MG tablet TAKE 1 TABLET BY MOUTH EVERY DAY    PHQ 2/9 Scores 02/17/2021 07/07/2020 09/17/2019  PHQ - 2 Score 4 0 0  PHQ- 9 Score 7 0 0    GAD 7 : Generalized Anxiety Score 02/17/2021 07/07/2020  Nervous, Anxious, on Edge 2 0  Control/stop worrying 2 0  Worry too much - different things 2 0  Trouble relaxing 0 0  Restless 0 0  Easily annoyed or irritable 0 0  Afraid - awful might happen 0 0  Total GAD 7 Score 6 0  Anxiety Difficulty Not difficult at all -    BP Readings from Last 3 Encounters:  02/17/21 (!) 148/94  07/07/20 130/80  09/17/19 (!) 146/82    Physical Exam Constitutional:      Appearance: Normal appearance.  Neck:     Vascular: No carotid bruit.  Cardiovascular:     Rate and Rhythm: Normal rate and regular rhythm.     Pulses: Normal pulses.     Heart sounds: No murmur heard. Pulmonary:     Effort: Pulmonary effort is normal.     Breath sounds: Normal breath sounds.  Musculoskeletal:     Right elbow: Normal.     Left elbow: Normal.     Right wrist: Normal.     Left wrist: Normal.     Right hand: Normal.     Left hand: Normal.     Cervical back: Normal range of motion.     Right lower leg: No edema.     Left lower leg: No edema.     Right ankle:     Right Achilles Tendon: Tenderness present.     Left ankle:     Left Achilles Tendon: Normal.     Right foot: Swelling, bunion and tenderness present.     Left foot: Normal.  Lymphadenopathy:     Cervical: No cervical adenopathy.  Neurological:     Mental Status: She is alert.    Wt Readings from Last 3 Encounters:  02/17/21 179 lb (81.2 kg)  07/07/20 176 lb (79.8 kg)  09/17/19 175 lb (79.4 kg)    BP (!) 148/94 (BP Location: Left Arm, Patient Position: Sitting, Cuff Size: Normal)   Pulse 78   Temp 98.1 F (36.7 C) (Oral)   Ht 5\' 5"  (1.651 m)   Wt 179 lb (81.2 kg)   SpO2 98%   BMI 29.79 kg/m   Assessment and Plan: 1. Essential hypertension BP is not controlled - pt attributes  this to job stress and joint pain. Will increase the dose to 20 mg. - lisinopril (ZESTRIL) 20 MG tablet; Take 1 tablet (20 mg total) by mouth daily.  Dispense: 90 tablet; Refill: 3 - CBC with Differential/Platelet - Comprehensive metabolic panel - TSH  2. Sciatica, right side Mild, intermittent low back pain No longer taking gabapentin, only Ibuprofen  3. Mild intermittent asthma without complication - albuterol (  VENTOLIN HFA) 108 (90 Base) MCG/ACT inhaler; TAKE 2 PUFFS BY MOUTH EVERY 6 HOURS AS NEEDED FOR WHEEZE OR SHORTNESS OF BREATH  Dispense: 8.5 each; Refill: 1  4. Pain in joint, multiple sites Stop Advil and begin Mobic Rule out RA/SLE - meloxicam (MOBIC) 15 MG tablet; Take 1 tablet (15 mg total) by mouth daily.  Dispense: 30 tablet; Refill: 0 - ANA w/Reflex if Positive - Rheumatoid factor - Sedimentation rate  5. Bunion of great toe of right foot Exacerbated by steel toed shoes required for work - Ambulatory referral to Podiatry  6. 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  Dispense: 90 tablet; Refill: 1  7. Encounter for screening mammogram for breast cancer No breast concerns Pt will schedule at [facility] - MM 3D SCREEN BREAST BILATERAL; Future  8. Colon cancer screening - Ambulatory referral to Gastroenterology  9. Need for shingles vaccine First dose today - Varicella-zoster vaccine IM   Partially dictated using Animal nutritionist. Any errors are unintentional.  Bari Edward, MD [facility]  02/17/2021

## 2021-02-17 NOTE — Patient Instructions (Addendum)
Stop Ibuprofen Take Meloxicam in its place Someone from the Podiatrist will call you for an appointment. Someone will call you about the colonoscopy. You can call for the mammogram appt

## 2021-02-18 LAB — CBC WITH DIFFERENTIAL/PLATELET
Basophils Absolute: 0.1 10*3/uL (ref 0.0–0.2)
Basos: 1 %
EOS (ABSOLUTE): 0.3 10*3/uL (ref 0.0–0.4)
Eos: 4 %
Hematocrit: 38.1 % (ref 34.0–46.6)
Hemoglobin: 12.2 g/dL (ref 11.1–15.9)
Immature Grans (Abs): 0.1 10*3/uL (ref 0.0–0.1)
Immature Granulocytes: 1 %
Lymphocytes Absolute: 2.4 10*3/uL (ref 0.7–3.1)
Lymphs: 30 %
MCH: 28.3 pg (ref 26.6–33.0)
MCHC: 32 g/dL (ref 31.5–35.7)
MCV: 88 fL (ref 79–97)
Monocytes Absolute: 1 10*3/uL — ABNORMAL HIGH (ref 0.1–0.9)
Monocytes: 13 %
Neutrophils Absolute: 4.3 10*3/uL (ref 1.4–7.0)
Neutrophils: 51 %
Platelets: 266 10*3/uL (ref 150–450)
RBC: 4.31 x10E6/uL (ref 3.77–5.28)
RDW: 11.7 % (ref 11.7–15.4)
WBC: 8.2 10*3/uL (ref 3.4–10.8)

## 2021-02-18 LAB — COMPREHENSIVE METABOLIC PANEL
ALT: 25 IU/L (ref 0–32)
AST: 21 IU/L (ref 0–40)
Albumin/Globulin Ratio: 1.1 — ABNORMAL LOW (ref 1.2–2.2)
Albumin: 3.8 g/dL (ref 3.8–4.8)
Alkaline Phosphatase: 61 IU/L (ref 44–121)
BUN/Creatinine Ratio: 20 (ref 9–23)
BUN: 12 mg/dL (ref 6–24)
Bilirubin Total: <0.2 mg/dL (ref 0.0–1.2)
CO2: 20 mmol/L (ref 20–29)
Calcium: 10 mg/dL (ref 8.7–10.2)
Chloride: 103 mmol/L (ref 96–106)
Creatinine, Ser: 0.59 mg/dL (ref 0.57–1.00)
Globulin, Total: 3.5 g/dL (ref 1.5–4.5)
Glucose: 96 mg/dL (ref 65–99)
Potassium: 4.4 mmol/L (ref 3.5–5.2)
Sodium: 138 mmol/L (ref 134–144)
Total Protein: 7.3 g/dL (ref 6.0–8.5)
eGFR: 110 mL/min/{1.73_m2} (ref >59)

## 2021-02-18 LAB — RHEUMATOID FACTOR: Rheumatoid fact SerPl-aCnc: <10 IU/mL (ref <14.0)

## 2021-02-18 LAB — ANA W/REFLEX IF POSITIVE: Anti Nuclear Antibody (ANA): NEGATIVE

## 2021-02-18 LAB — TSH: TSH: 3.48 u[IU]/mL (ref 0.450–4.500)

## 2021-02-18 LAB — SEDIMENTATION RATE: Sed Rate: 17 mm/hr (ref 0–40)

## 2021-02-24 ENCOUNTER — Encounter: Payer: Self-pay | Admitting: *Deleted

## 2021-02-24 ENCOUNTER — Encounter: Payer: Self-pay | Admitting: Internal Medicine

## 2021-02-24 ENCOUNTER — Ambulatory Visit (INDEPENDENT_AMBULATORY_CARE_PROVIDER_SITE_OTHER): Payer: BC Managed Care – PPO | Admitting: Internal Medicine

## 2021-02-24 ENCOUNTER — Other Ambulatory Visit: Payer: Self-pay

## 2021-02-24 VITALS — BP 148/92 | HR 69 | Temp 97.9°F | Ht 65.0 in | Wt 180.0 lb

## 2021-02-24 DIAGNOSIS — R519 Headache, unspecified: Secondary | ICD-10-CM | POA: Diagnosis not present

## 2021-02-24 DIAGNOSIS — I10 Essential (primary) hypertension: Secondary | ICD-10-CM | POA: Diagnosis not present

## 2021-02-24 DIAGNOSIS — R0789 Other chest pain: Secondary | ICD-10-CM | POA: Diagnosis not present

## 2021-02-24 MED ORDER — AMLODIPINE BESYLATE 5 MG PO TABS: 5.0000 mg | ORAL_TABLET | Freq: Every day | ORAL | 0 refills | Status: DC

## 2021-02-24 NOTE — Progress Notes (Signed)
Date:  02/24/2021   Name:  Susan Durham   DOB:  1971/07/30   MRN:  [id number]   Chief Complaint: Hypertension (At work today, light headed since last night, took lisinopril last night )  Hypertension This is a chronic problem. The problem is unchanged. The problem is resistant (increased dose of lisinopril one week ago). Associated symptoms include chest pain and headaches. Pertinent negatives include no palpitations or shortness of breath. (Started yesterday after exposure to fumes at work.  Headache was bad last night but has eased up the tylenol migraine and rest. ) Past treatments include ACE inhibitors (dose increased to 20 mg last visit). There are no compliance problems.   Headache  This is a new problem. The current episode started yesterday. The problem has been gradually improving. The pain is located in the Frontal region. The pain does not radiate. The pain quality is not similar to prior headaches. The quality of the pain is described as throbbing and pulsating. The pain is moderate. Pertinent negatives include no dizziness, fever, scalp tenderness or weakness. The symptoms are aggravated by work (esp strong fumes). She has tried Excedrin for the symptoms. The treatment provided moderate relief. Her past medical history is significant for hypertension.   Lab Results  Component Value Date   CREATININE 0.59 02/17/2021   BUN 12 02/17/2021   NA 138 02/17/2021   K 4.4 02/17/2021   CL 103 02/17/2021   CO2 20 02/17/2021   No results found for: CHOL, HDL, LDLCALC, LDLDIRECT, TRIG, CHOLHDL Lab Results  Component Value Date   TSH 3.480 02/17/2021   No results found for: HGBA1C Lab Results  Component Value Date   WBC 8.2 02/17/2021   HGB 12.2 02/17/2021   HCT 38.1 02/17/2021   MCV 88 02/17/2021   PLT 266 02/17/2021   Lab Results  Component Value Date   ALT 25 02/17/2021   AST 21 02/17/2021   ALKPHOS 61 02/17/2021   BILITOT <0.2 02/17/2021     Review of Systems   Constitutional:  Negative for chills, fatigue and fever.  Eyes:  Negative for visual disturbance.  Respiratory:  Positive for chest tightness. Negative for shortness of breath and wheezing.   Cardiovascular:  Positive for chest pain. Negative for palpitations and leg swelling.  Neurological:  Positive for headaches. Negative for dizziness, tremors, weakness and light-headedness.   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   Essential hypertension 07/07/2020   Sciatica, right side 07/07/2020   Carpal tunnel syndrome on right 07/07/2020   Mild intermittent asthma without complication 07/07/2020    Allergies  Allergen Reactions   Morphine And Related Anaphylaxis    Past Surgical History:  Procedure Laterality Date   ABDOMINAL HYSTERECTOMY      Social History   Tobacco Use   Smoking status: Former    Packs/day: 0.50    Years: 30.00    Pack years: 15.00    Types: Cigarettes    Quit date: 10/30/2018    Years since quitting: 2.3   Smokeless tobacco: Never  Vaping Use   Vaping Use: Never used  Substance Use Topics   Alcohol use: Never   Drug use: Never     Medication list has been reviewed and updated.  Current Meds  Medication Sig   albuterol (VENTOLIN HFA) 108 (90 Base) MCG/ACT inhaler TAKE 2 PUFFS BY MOUTH EVERY 6 HOURS AS NEEDED FOR WHEEZE OR SHORTNESS OF BREATH  amLODipine (NORVASC) 5 MG tablet Take 1 tablet (5 mg total) by mouth daily.   estradiol (ESTRACE) 2 MG tablet Take 1 tablet (2 mg total) by mouth daily.   lisinopril (ZESTRIL) 20 MG tablet Take 1 tablet (20 mg total) by mouth daily.   meloxicam (MOBIC) 15 MG tablet Take 1 tablet (15 mg total) by mouth daily. (Patient taking differently: Take 15 mg by mouth as needed.)   [DISCONTINUED] vitamin B-12 (CYANOCOBALAMIN) 500 MCG tablet Take 500 mcg by mouth daily.    PHQ 2/9 Scores 02/24/2021 02/17/2021 07/07/2020 09/17/2019  PHQ - 2 Score 0 4 0 0  PHQ- 9 Score 3 7 0  0    GAD 7 : Generalized Anxiety Score 02/24/2021 02/17/2021 07/07/2020  Nervous, Anxious, on Edge 1 2 0  Control/stop worrying 1 2 0  Worry too much - different things 1 2 0  Trouble relaxing 0 0 0  Restless 0 0 0  Easily annoyed or irritable 0 0 0  Afraid - awful might happen 0 0 0  Total GAD 7 Score 3 6 0  Anxiety Difficulty - Not difficult at all -    BP Readings from Last 3 Encounters:  02/24/21 (!) 148/92  02/17/21 (!) 148/94  07/07/20 130/80    Physical Exam Constitutional:      Appearance: Normal appearance.  HENT:     Head:     Jaw: There is normal jaw occlusion.     Right Ear: Tympanic membrane normal.     Left Ear: Tympanic membrane normal.  Eyes:     Extraocular Movements: Extraocular movements intact.     Conjunctiva/sclera: Conjunctivae normal.     Pupils: Pupils are equal, round, and reactive to light.  Neck:     Vascular: No carotid bruit.  Cardiovascular:     Rate and Rhythm: Normal rate and regular rhythm.     Pulses: Normal pulses.  Pulmonary:     Effort: Pulmonary effort is normal.     Breath sounds: No wheezing or rhonchi.  Musculoskeletal:     Cervical back: Normal range of motion.  Lymphadenopathy:     Cervical: No cervical adenopathy.  Neurological:     Mental Status: She is alert.    Wt Readings from Last 3 Encounters:  02/24/21 180 lb (81.6 kg)  02/17/21 179 lb (81.2 kg)  07/07/20 176 lb (79.8 kg)    BP (!) 148/92 (Patient Position: Supine)   Pulse 69   Temp 97.9 F (36.6 C) (Oral)   Ht 5\' 5"  (1.651 m)   Wt 180 lb (81.6 kg)   SpO2 97%   BMI 29.95 kg/m   Assessment and Plan: 1. Essential hypertension Exacerbated by the HA caused by fumes at work. EKG is normal.  Exam is unremarkable. BP has come down while here and now is an acceptable range Will continue lisinopril 20 mg and add Norvasc 5 mg Note given to be out of work today.  Follow up in one month. - amLODipine (NORVASC) 5 MG tablet; Take 1 tablet (5 mg total) by mouth  daily.  Dispense: 90 tablet; Refill: 0  2. Atypical chest pain Pt is reassured - follow up if not resolving - EKG 12-Lead  3. Acute nonintractable headache, unspecified headache type Continue to take tylenol as needed for HA - if escalating, recommend ED evaluation Note given to be out of work today   Partially . Any errors are unintentional.  Contractor, MD [facility]  02/24/2021

## 2021-02-24 NOTE — Patient Instructions (Signed)
Continue lisinopril 20 mg per day  Add amlodipine 5 mg per day  Follow up with me in one month

## 2021-02-26 ENCOUNTER — Ambulatory Visit: Payer: Self-pay

## 2021-02-26 NOTE — Telephone Encounter (Signed)
Using [facility]   Susan Durham CB#[id number]. Patient called and says she has high blood pressure with dizziness, headache so bad it feels like her eyes are about to pop out. Her BP today is 165/80. She says yesterday it was 171/111. She says she went to her doctor on Wednesday and was started on 2 BP medications. She says she took them both and has not missed any doses. She also reports having a dry mouth, but no other symptoms reported. Advised due to the symptoms she's having to go to the ED, care advice given, patient verbalized understanding.  Reason for Disposition  [1] Systolic BP  >= 160 OR Diastolic >= 100 AND [2] cardiac or neurologic symptoms (e.g., chest pain, difficulty breathing, unsteady gait, blurred vision)  Answer Assessment - Initial Assessment Questions 1. BLOOD PRESSURE: "What is the blood pressure?" "Did you take at least two measurements 5 minutes apart?"     165/80 2. ONSET: "When did you take your blood pressure?"     Now at work 3. HOW: "How did you obtain the blood pressure?" (e.g., visiting nurse, automatic home BP monitor)     BP cuff at work 4. HISTORY: "Do you have a history of high blood pressure?"     Yes 5. MEDICATIONS: "Are you taking any medications for blood pressure?" "Have you missed any doses recently?"     Yes, no missed doses 6. OTHER SYMPTOMS: "Do you have any symptoms?" (e.g., headache, chest pain, blurred vision, difficulty breathing, weakness)     Dizziness, headache, dry mouth 7. PREGNANCY: "Is there any chance you are pregnant?" "When was your last menstrual period?"     No  Protocols used: Blood Pressure - High-A-AH

## 2021-02-26 NOTE — Telephone Encounter (Signed)
Noted  Pt advised to go to ER.  KP

## 2021-03-16 ENCOUNTER — Other Ambulatory Visit: Payer: Self-pay | Admitting: Internal Medicine

## 2021-03-16 DIAGNOSIS — M2550 Pain in unspecified joint: Secondary | ICD-10-CM

## 2021-03-16 NOTE — Telephone Encounter (Signed)
Requested Prescriptions  Pending Prescriptions Disp Refills  . meloxicam (MOBIC) 15 MG tablet [Pharmacy Med Name: MELOXICAM 15 MG TABLET] 30 tablet 2    Sig: TAKE 1 TABLET (15 MG TOTAL) BY MOUTH DAILY.     Analgesics:  COX2 Inhibitors Passed - 03/16/2021  1:28 AM      Passed - HGB in normal range and within 360 days    Hemoglobin  Date Value Ref Range Status  02/17/2021 12.2 11.1 - 15.9 g/dL Final         Passed - Cr in normal range and within 360 days    Creatinine, Ser  Date Value Ref Range Status  02/17/2021 0.59 0.57 - 1.00 mg/dL Final         Passed - Patient is not pregnant      Passed - Valid encounter within last 12 months    Recent Outpatient Visits          2 weeks ago Essential hypertension   [facility] Reubin Milan, MD   3 weeks ago Essential hypertension   [facility] Reubin Milan, MD   8 months ago Sciatica, right side   [facility] Reubin Milan, MD   1 year ago Essential hypertension   [facility] Reubin Milan, MD      Future Appointments            In 2 months Susan Graves Nyoka Cowden, MD [facility], [facility]

## 2021-03-31 ENCOUNTER — Emergency Department: Payer: No Typology Code available for payment source

## 2021-03-31 ENCOUNTER — Other Ambulatory Visit: Payer: Self-pay

## 2021-03-31 ENCOUNTER — Emergency Department
Admission: EM | Admit: 2021-03-31 | Discharge: 2021-03-31 | Disposition: A | Discharge status: Home / Self Care | Payer: No Typology Code available for payment source | Attending: Emergency Medicine | Admitting: Emergency Medicine

## 2021-03-31 DIAGNOSIS — J45909 Unspecified asthma, uncomplicated: Secondary | ICD-10-CM | POA: Diagnosis not present

## 2021-03-31 DIAGNOSIS — Z79899 Other long term (current) drug therapy: Secondary | ICD-10-CM | POA: Insufficient documentation

## 2021-03-31 DIAGNOSIS — Z87891 Personal history of nicotine dependence: Secondary | ICD-10-CM | POA: Diagnosis not present

## 2021-03-31 DIAGNOSIS — S8002XA Contusion of left knee, initial encounter: Secondary | ICD-10-CM | POA: Diagnosis not present

## 2021-03-31 DIAGNOSIS — R52 Pain, unspecified: Secondary | ICD-10-CM

## 2021-03-31 DIAGNOSIS — Y990 Civilian activity done for income or pay: Secondary | ICD-10-CM | POA: Insufficient documentation

## 2021-03-31 DIAGNOSIS — S4991XA Unspecified injury of right shoulder and upper arm, initial encounter: Secondary | ICD-10-CM | POA: Diagnosis present

## 2021-03-31 DIAGNOSIS — I10 Essential (primary) hypertension: Secondary | ICD-10-CM | POA: Diagnosis not present

## 2021-03-31 DIAGNOSIS — M25531 Pain in right wrist: Secondary | ICD-10-CM | POA: Insufficient documentation

## 2021-03-31 DIAGNOSIS — R519 Headache, unspecified: Secondary | ICD-10-CM | POA: Diagnosis not present

## 2021-03-31 DIAGNOSIS — S43004A Unspecified dislocation of right shoulder joint, initial encounter: Secondary | ICD-10-CM | POA: Diagnosis not present

## 2021-03-31 DIAGNOSIS — S8001XA Contusion of right knee, initial encounter: Secondary | ICD-10-CM | POA: Diagnosis not present

## 2021-03-31 DIAGNOSIS — W01198A Fall on same level from slipping, tripping and stumbling with subsequent striking against other object, initial encounter: Secondary | ICD-10-CM | POA: Diagnosis not present

## 2021-03-31 DIAGNOSIS — S8000XA Contusion of unspecified knee, initial encounter: Secondary | ICD-10-CM

## 2021-03-31 MED ORDER — FENTANYL CITRATE (PF) 100 MCG/2ML IJ SOLN
50.0000 ug | Freq: Once | INTRAMUSCULAR | Status: AC
  Administered 2021-03-31: 50 ug via INTRAVENOUS
  Filled 2021-03-31: qty 2

## 2021-03-31 MED ORDER — ONDANSETRON HCL 4 MG/2ML IJ SOLN
4.0000 mg | Freq: Once | INTRAMUSCULAR | Status: AC
  Administered 2021-03-31: 4 mg via INTRAVENOUS
  Filled 2021-03-31: qty 2

## 2021-03-31 MED ORDER — IBUPROFEN 400 MG PO TABS
400.0000 mg | ORAL_TABLET | Freq: Once | ORAL | Status: AC
  Administered 2021-03-31: 400 mg via ORAL
  Filled 2021-03-31: qty 1

## 2021-03-31 MED ORDER — ACETAMINOPHEN 500 MG PO TABS
1000.0000 mg | ORAL_TABLET | Freq: Once | ORAL | Status: AC
  Administered 2021-03-31: 1000 mg via ORAL
  Filled 2021-03-31: qty 2

## 2021-03-31 MED ORDER — LACTATED RINGERS IV BOLUS
1000.0000 mL | Freq: Once | INTRAVENOUS | Status: AC
  Administered 2021-03-31: 1000 mL via INTRAVENOUS

## 2021-03-31 MED ORDER — FENTANYL CITRATE (PF) 100 MCG/2ML IJ SOLN
INTRAMUSCULAR | Status: AC
  Administered 2021-03-31: 25 ug
  Filled 2021-03-31: qty 2

## 2021-03-31 MED ORDER — PROPOFOL 10 MG/ML IV BOLUS
2.0000 mg/kg | Freq: Once | INTRAVENOUS | Status: AC
  Administered 2021-03-31: 76.2 mg via INTRAVENOUS
  Filled 2021-03-31: qty 20

## 2021-03-31 NOTE — ED Notes (Signed)
Patient to room 19 for moderate sedation, moved via stretcher.

## 2021-03-31 NOTE — ED Notes (Signed)
Unable to sign discharge due to fracture and pain

## 2021-03-31 NOTE — ED Provider Notes (Signed)
[facility] Emergency Department Provider Note  ____________________________________________   Event Date/Time   First MD Initiated Contact with Patient 03/31/21 1153     (approximate)  I have reviewed the triage vital signs and the nursing notes.   HISTORY  Chief Complaint Arm Injury   HPI Susan Durham is a 50 y.o. female with past medical history of hypertension who presents accompanied by her boyfriend for assessment of headache, right shoulder pain, right elbow pain, right wrist pain and bilateral knee pain that she states all began when she tripped and fell onto the floor while at work.  She did not have any LOC C and is not on any blood thinners.  She denies any midline neck pain back pain chest pain abdominal pain.  She denies any left upper extremity pain or any pain in her hips or ankles.  No recent fevers, chills, cough, nausea, vomiting, Derica dysuria, rash or any other recent sick symptoms.  Patient is adamant there is no chance she can be pregnant states she is comfortable getting ibuprofen and propofol prior to getting recommended pregnancy test.       Past Medical History:  Diagnosis Date   Hypertension     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   Essential hypertension 07/07/2020   Sciatica, right side 07/07/2020   Carpal tunnel syndrome on right 07/07/2020   Mild intermittent asthma without complication 07/07/2020    Past Surgical History:  Procedure Laterality Date   ABDOMINAL HYSTERECTOMY      Prior to Admission medications   Medication Sig Start Date End Date Taking? Authorizing Provider  albuterol (VENTOLIN HFA) 108 (90 Base) MCG/ACT inhaler TAKE 2 PUFFS BY MOUTH EVERY 6 HOURS AS NEEDED FOR WHEEZE OR SHORTNESS OF BREATH 02/17/21   Reubin Milan, MD  amLODipine (NORVASC) 5 MG tablet Take 1 tablet (5 mg total) by mouth daily. 02/24/21   Reubin Milan, MD  estradiol  (ESTRACE) 2 MG tablet Take 1 tablet (2 mg total) by mouth daily. 02/17/21   Reubin Milan, MD  lisinopril (ZESTRIL) 20 MG tablet Take 1 tablet (20 mg total) by mouth daily. 02/17/21   Reubin Milan, MD  meloxicam (MOBIC) 15 MG tablet TAKE 1 TABLET (15 MG TOTAL) BY MOUTH DAILY. 03/16/21   Reubin Milan, MD    Allergies Morphine and related  Family History  Problem Relation Age of Onset   Sleep apnea Mother    Hypertension Mother    Diabetes Mother    Hypertension Father    Throat cancer Father    Diabetes Father    Hypertension Maternal Aunt    Diabetes Maternal Aunt    Hypertension Maternal Uncle    Hypertension Paternal Aunt    Hypertension Paternal Uncle     Social History Social History   Tobacco Use   Smoking status: Former    Packs/day: 0.50    Years: 30.00    Pack years: 15.00    Types: Cigarettes    Quit date: 10/30/2018    Years since quitting: 2.4   Smokeless tobacco: Never  Vaping Use   Vaping Use: Never used  Substance Use Topics   Alcohol use: Never   Drug use: Never    Review of Systems  Review of Systems  Constitutional:  Negative for chills and fever.  HENT:  Negative for sore throat.   Eyes:  Negative for pain.  Respiratory:  Negative  for cough and stridor.   Cardiovascular:  Negative for chest pain.  Gastrointestinal:  Negative for vomiting.  Musculoskeletal:  Positive for joint pain (R shoulder, R elbow, R wrist, b/l knees).  Skin:  Negative for rash.  Neurological:  Positive for headaches. Negative for seizures and loss of consciousness.  Psychiatric/Behavioral:  Negative for suicidal ideas.   All other systems reviewed and are negative.    ____________________________________________   PHYSICAL EXAM:  VITAL SIGNS: ED Triage Vitals  Enc Vitals Group     BP 03/31/21 1120 140/82     Pulse Rate 03/31/21 1120 75     Resp 03/31/21 1120 20     Temp 03/31/21 1120 98.4 F (36.9 C)     Temp Source 03/31/21 1120 Oral     SpO2  03/31/21 1120 100 %     Weight 03/31/21 1134 168 lb (76.2 kg)     Height 03/31/21 1134 5\' 4"  (1.626 m)     Head Circumference --      Peak Flow --      Pain Score 03/31/21 1134 10     Pain Loc --      Pain Edu? --      Excl. in GC? --    Vitals:   03/31/21 1530 03/31/21 1545  BP: 134/82 137/83  Pulse: 70 85  Resp: 17 13  Temp:    SpO2: 100% 99%   Physical Exam Vitals and nursing note reviewed.  Constitutional:      General: She is not in acute distress.    Appearance: She is well-developed.  HENT:     Head: Normocephalic and atraumatic.     Right Ear: External ear normal.     Left Ear: External ear normal.     Nose: Nose normal.  Eyes:     Conjunctiva/sclera: Conjunctivae normal.  Cardiovascular:     Rate and Rhythm: Normal rate and regular rhythm.     Heart sounds: No murmur heard. Pulmonary:     Effort: Pulmonary effort is normal. No respiratory distress.     Breath sounds: Normal breath sounds.  Abdominal:     Palpations: Abdomen is soft.     Tenderness: There is no abdominal tenderness.  Musculoskeletal:     Cervical back: Neck supple.  Skin:    General: Skin is warm and dry.     Capillary Refill: Capillary refill takes less than 2 seconds.  Neurological:     Mental Status: She is alert and oriented to person, place, and time.    Cranial nerves II through XII grossly intact.  No significant tenderness over the C/T/L-spine.  There is deformity to the right shoulder patient is unable to move the right upper extremity the right shoulder.  She is able to grip this examiner in the right hand all endorses significant pain and seem to have some tenderness of the right snuffbox.  No deformity effusion or tenderness otherwise about the wrist although some mild tenderness about the posterior aspect of elbow.  No significant effusion or deformity of the right elbow.  Patient is able to flex and extend it with seemingly symmetric strength compared to the left.  She has some  erythema and mild tenderness over the bilateral anterior patellas.  There is no other effusion deformity or other evidence of trauma to the bilateral lower extremities.  Never extremity sensation is intact in the distribution of the radial ulnar and median nerves.  2+ radial pulses.  Sensation is also intact throughout  lower extremities to light touch.  2+ DP pulses.  Patient has erythema over her forehead but no other obvious trauma to face scalp head or neck. ____________________________________________   LABS (all labs ordered are listed, but only abnormal results are displayed)  Labs Reviewed  HCG, QUANTITATIVE, PREGNANCY   ____________________________________________  EKG  ____________________________________________  RADIOLOGY  ED MD interpretation: Films of the bilateral knee show no acute fracture or dislocation.  Plain film of the right shoulder shows anterior inferior glenohumeral dislocation.  Plain film of the right elbow shows small elbow effusion without clear fracture.  Plan film of the wrist shows no clear fracture dislocation.  Post reduction plain film of the right shoulder shows successful reduction with a small Hill-Sachs deformity.  CT head and C-spine is unremarkable for intracranial hemorrhage, skull fracture or acute C-spine injury.  CT right elbow shows no acute fracture.  Official radiology report(s): DG Shoulder Right  Result Date: 03/31/2021 CLINICAL DATA:  Fall today. Right shoulder and bilateral knee pain. EXAM: RIGHT SHOULDER - 2+ VIEW COMPARISON:  None. FINDINGS: AP and Y-views are submitted. There is anterior inferior glenohumeral dislocation. No definite acute fracture seen on these views, although a mild Hill-Sachs deformity is difficult to exclude. The distal clavicle and acromioclavicular joint appear intact. IMPRESSION: Anterior inferior glenohumeral dislocation without definite fracture. Recommend post reduction views. Electronically Signed   By: Carey Bullocks M.D.   On: 03/31/2021 13:37   DG Elbow 2 Views Right  Result Date: 03/31/2021 CLINICAL DATA:  Fall today with multifocal joint pain. EXAM: RIGHT ELBOW - 2 VIEW COMPARISON:  None. FINDINGS: The mineralization and alignment are normal. There is no evidence of acute fracture or dislocation. The joint spaces appear preserved. The anterior fat pad appears slightly up lifted, although the posterior fat pad is not visualized on the lateral view. The soft tissues appear unremarkable. IMPRESSION: Difficult to exclude a small elbow joint effusion which in the setting of trauma could indicate an occult fracture. No displaced fracture identified. Electronically Signed   By: Carey Bullocks M.D.   On: 03/31/2021 13:33   DG Wrist 2 Views Right  Result Date: 03/31/2021 CLINICAL DATA:  Fall today. EXAM: RIGHT WRIST - 2 VIEW COMPARISON:  None. FINDINGS: The mineralization and alignment are normal. There is no evidence of acute fracture or dislocation. The joint spaces appear preserved. No focal soft tissue abnormality identified. Mild ulnar minus variance at the wrist. IMPRESSION: Negative two view right wrist series. Electronically Signed   By: Carey Bullocks M.D.   On: 03/31/2021 13:35   CT HEAD WO CONTRAST ( )  Result Date: 03/31/2021 CLINICAL DATA:  Fall, right side pain EXAM: CT HEAD WITHOUT CONTRAST TECHNIQUE: Contiguous axial images were obtained from the base of the skull through the vertex without intravenous contrast. COMPARISON:  None. FINDINGS: Brain: No acute intracranial abnormality. Specifically, no hemorrhage, hydrocephalus, mass lesion, acute infarction, or significant intracranial injury. Vascular: No hyperdense vessel or unexpected calcification. Skull: No acute calvarial abnormality. Sinuses/Orbits: No acute findings Other: None IMPRESSION: No acute intracranial abnormality. Electronically Signed   By: Charlett Nose M.D.   On: 03/31/2021 15:30   CT Cervical Spine Wo Contrast  Result Date:  03/31/2021 CLINICAL DATA:  Fall, head trauma EXAM: CT CERVICAL SPINE WITHOUT CONTRAST TECHNIQUE: Multidetector CT imaging of the cervical spine was performed without intravenous contrast. Multiplanar CT image reconstructions were also generated. COMPARISON:  None. FINDINGS: Alignment: No subluxation.  Loss of cervical lordosis. Skull base and vertebrae: No acute  fracture. No primary bone lesion or focal pathologic process. Soft tissues and spinal canal: No prevertebral fluid or swelling. No visible canal hematoma. Disc levels:  Maintained Upper chest: Negative Other: None IMPRESSION: Loss of cervical lordosis which may be positional or related to muscle spasm. No bony abnormality. Electronically Signed   By: Charlett Nose M.D.   On: 03/31/2021 15:31   CT Elbow Right Wo Contrast  Result Date: 03/31/2021 CLINICAL DATA:  Negative x-ray, right elbow pain EXAM: CT OF THE UPPER RIGHT EXTREMITY WITHOUT CONTRAST TECHNIQUE: Multidetector CT imaging of the upper right extremity was performed according to the standard protocol. COMPARISON:  None. FINDINGS: Bones/Joint/Cartilage There is no evidence of acute fracture. There is no significant degenerative change. There is no joint effusion. Ligaments Suboptimally assessed by CT. Muscles and Tendons No muscle atrophy.  No acute tendon tear on noncontrast CT. Soft tissues Unremarkable. IMPRESSION: No evidence of acute fracture or joint effusion. Electronically Signed   By: Caprice Renshaw   On: 03/31/2021 15:47   DG Shoulder Right Portable  Result Date: 03/31/2021 CLINICAL DATA:  Right shoulder dislocation status post reduction EXAM: PORTABLE RIGHT SHOULDER COMPARISON:  03/31/2021 at 1227 hours FINDINGS: Successful reduction of a right glenohumeral joint dislocation. Two tiny mineralized densities adjacent to the greater tuberosity which may reflect tiny chip fracture fragments. Small Hill-Sachs impaction deformity. No definite glenoid fracture is seen. AC joint intact and  unremarkable. Soft tissues appear within normal limits. IMPRESSION: 1. Successful reduction of a right glenohumeral joint dislocation. 2. Small Hill-Sachs deformity. Two tiny mineralized densities adjacent to the greater tuberosity which may reflect tiny chip fracture fragments. No definite glenoid fracture. Electronically Signed   By: Duanne Guess D.O.   On: 03/31/2021 14:37   DG Knee Complete 4 Views Left  Result Date: 03/31/2021 CLINICAL DATA:  Fall today.  Right shoulder and bilateral knee pain. EXAM: LEFT KNEE - COMPLETE 4+ VIEW COMPARISON:  None. FINDINGS: The mineralization and alignment are normal. There is no evidence of acute fracture or dislocation. The joint spaces are preserved. There is no evidence of knee joint effusion. Mild spurring at the quadriceps insertion on the patella is noted. The soft tissues appear unremarkable. IMPRESSION: No acute osseous findings. Electronically Signed   By: Carey Bullocks M.D.   On: 03/31/2021 13:34   DG Knee Complete 4 Views Right  Result Date: 03/31/2021 CLINICAL DATA:  Fall today. Right shoulder and bilateral knee pain. EXAM: RIGHT KNEE - COMPLETE 4+ VIEW COMPARISON:  None. FINDINGS: The mineralization and alignment are normal. There is no evidence of acute fracture or dislocation. The joint spaces are preserved. No joint effusion or focal soft tissue abnormality identified. There is mild spurring at the quadriceps insertion on the patella. IMPRESSION: No acute osseous findings. Electronically Signed   By: Carey Bullocks M.D.   On: 03/31/2021 13:34    ____________________________________________   PROCEDURES  Procedure(s) performed (including Critical Care):  .Sedation  Date/Time: 03/31/2021 3:01 PM Performed by: Gilles Chiquito, MD Authorized by: Gilles Chiquito, MD   Consent:    Consent obtained:  Verbal   Consent given by:  Patient   Risks discussed:  Allergic reaction, dysrhythmia, inadequate sedation, nausea, vomiting, respiratory  compromise necessitating ventilatory assistance and intubation, prolonged sedation necessitating reversal and prolonged hypoxia resulting in organ damage   Alternatives discussed:  Analgesia without sedation Universal protocol:    Immediately prior to procedure, a time out was called: yes     Patient identity confirmed:  Arm band and provided demographic data Indications:    Procedure performed:  Dislocation reduction   Procedure necessitating sedation performed by:  Physician performing sedation Pre-sedation assessment:    Time since last food or drink:  Unable to specify   NPO status caution: unable to specify NPO status     ASA classification: class 1 - normal, healthy patient     Mouth opening:  3 or more finger widths   Thyromental distance:  4 finger widths   Mallampati score:  I - soft palate, uvula, fauces, pillars visible   Neck mobility: normal     Pre-sedation assessments completed and reviewed: airway patency, cardiovascular function, hydration status, mental status, nausea/vomiting, pain level, respiratory function and temperature   Immediate pre-procedure details:    Reassessment: Patient reassessed immediately prior to procedure     Reviewed: vital signs, relevant labs/tests and NPO status     Verified: bag valve mask available, emergency equipment available, intubation equipment available, IV patency confirmed, oxygen available, reversal medications available and suction available   Procedure details (see MAR for exact dosages):    Preoxygenation:  Nasal cannula   Sedation:  Propofol   Intended level of sedation: deep   Analgesia:  Fentanyl   Intra-procedure monitoring:  Blood pressure monitoring, cardiac monitor, continuous pulse oximetry, continuous capnometry, frequent LOC assessments and frequent vital sign checks   Intra-procedure events: none     Total Provider sedation time (minutes):  15 Post-procedure details:    Attendance: Constant attendance by certified  staff until patient recovered     Recovery: Patient returned to pre-procedure baseline     Post-sedation assessments completed and reviewed: airway patency, cardiovascular function, hydration status, mental status, nausea/vomiting, pain level, respiratory function and temperature     Patient is stable for discharge or admission: yes     Procedure completion:  Tolerated well, no immediate complications Reduction of dislocation  Date/Time: 03/31/2021 3:02 PM Performed by: Gilles ChiquitoSmith, Harshal Sirmon P, MD Authorized by: Gilles ChiquitoSmith, Sallyanne Birkhead P, MD  Consent: Verbal consent obtained. Consent given by: patient Patient identity confirmed: verbally with patient Time out: Immediately prior to procedure a "time out" was called to verify the correct patient, procedure, equipment, support staff and site/side marked as required. Local anesthesia used: no  Anesthesia: Local anesthesia used: no  Sedation: Patient sedated: yes Sedatives: propofol Analgesia: fentanyl  Patient tolerance: patient tolerated the procedure well with no immediate complications     ____________________________________________   INITIAL IMPRESSION / ASSESSMENT AND PLAN / ED COURSE      Patient presents with above-stated history exam for assessment after mechanical fall described above.  She is complaining of pain in her head, right shoulder, elbow and wrist as well as bilateral knees.  On arrival she is afebrile and hemodynamically stable.  She does have some erythema of her forehead and some erythema mild tenderness of the bilateral knees.  There is also deformity of the right shoulder and some tenderness over the posterior elbow and over the right snuffbox area but patient is otherwise neurovascular intact in all extremities.  Right shoulder reduced per procedure note above.  Films of the bilateral knee show no acute fracture or dislocation.  Plain film of the right shoulder shows anterior inferior glenohumeral dislocation.  Plain film of  the right elbow shows small elbow effusion without clear fracture.  Plan film of the wrist shows no clear fracture dislocation.  Post reduction plain film of the right shoulder shows successful reduction with a small Hill-Sachs deformity.  CT head and C-spine is unremarkable for intracranial hemorrhage, skull fracture or acute C-spine injury.  CT right elbow shows no acute fracture.  CT was obtained with some equivocal findings on plain film of the right elbow.  Given I cannot rule out occult scaphoid injury patient placed in splint and instructed to follow-up with Ortho in 1 week for repeat imaging.  She was placed in a sling status post reduction of her shoulder.  I have a low suspicion for significant occult or visceral injury.  Given she is otherwise neurovascular intact and has evidence of successful reduction on postreduction plain film think she is stable for discharge with outpatient PCP and Ortho follow-up.  Discharged stable condition.  Strict return precautions advised and discussed.  ____________________________________________   FINAL CLINICAL IMPRESSION(S) / ED DIAGNOSES  Final diagnoses:  Closed dislocation of right shoulder, initial encounter  Right wrist pain  Contusion of knee, unspecified laterality, initial encounter    Medications  fentaNYL (SUBLIMAZE) 100 MCG/2ML injection (25 mcg  Given 03/31/21 1201)  lactated ringers bolus 1,000 mL (0 mLs Intravenous Stopped 03/31/21 1553)  ondansetron (ZOFRAN) injection 4 mg (4 mg Intravenous Given 03/31/21 1219)  propofol (DIPRIVAN) 10 mg/mL bolus/IV push 152.4 mg (76.2 mg Intravenous Given 03/31/21 1357)  fentaNYL (SUBLIMAZE) injection 50 mcg (50 mcg Intravenous Given 03/31/21 1328)  acetaminophen (TYLENOL) tablet 1,000 mg (1,000 mg Oral Given 03/31/21 1448)  ibuprofen (ADVIL) tablet 400 mg (400 mg Oral Given 03/31/21 1448)     ED Discharge Orders     None        Note:  This document was prepared using Dragon voice recognition software  and may include unintentional dictation errors.    Gilles Chiquito, MD 03/31/21 [phone]

## 2021-03-31 NOTE — ED Notes (Signed)
Shoulder immobilizer in place. Patient refused wrist splint

## 2021-03-31 NOTE — ED Triage Notes (Signed)
Pt to ED for right arm injury, reports was walking and tripped over door.  Deformity noted to shoulder Denies hitting head when fell Pt appears uncomfortable   Interpreter jackie used

## 2021-03-31 NOTE — ED Notes (Signed)
First Nurse Note: Pt to ED via POV. Pt states that she fell at work and thinks she broke her arm. Pt is in NAD.

## 2021-03-31 NOTE — ED Notes (Signed)
Patient screaming with right shoulder and bilateral knee pain.  States left shoulder "is broken or something."  Family member at bedside patient is screaming and holding right shoulder.  Right shoulder is lower than left slightly forward no abrasions noted.  Bilateral knee pain, no abrasions or deformity noted. Bed low and locked call bell in reach.

## 2021-04-12 ENCOUNTER — Other Ambulatory Visit: Payer: Self-pay | Admitting: Student

## 2021-04-12 DIAGNOSIS — S43004A Unspecified dislocation of right shoulder joint, initial encounter: Secondary | ICD-10-CM

## 2021-04-12 DIAGNOSIS — S46011A Strain of muscle(s) and tendon(s) of the rotator cuff of right shoulder, initial encounter: Secondary | ICD-10-CM

## 2021-04-20 ENCOUNTER — Ambulatory Visit: Payer: BC Managed Care – PPO

## 2021-04-20 ENCOUNTER — Ambulatory Visit: Admission: RE | Admit: 2021-04-20 | Payer: BC Managed Care – PPO | Source: Ambulatory Visit

## 2021-05-06 ENCOUNTER — Other Ambulatory Visit: Payer: Self-pay | Admitting: Internal Medicine

## 2021-05-06 DIAGNOSIS — I10 Essential (primary) hypertension: Secondary | ICD-10-CM

## 2021-05-25 ENCOUNTER — Ambulatory Visit: Payer: BC Managed Care – PPO | Admitting: Internal Medicine

## 2021-07-30 ENCOUNTER — Other Ambulatory Visit: Payer: Self-pay | Admitting: Internal Medicine

## 2021-07-30 DIAGNOSIS — I10 Essential (primary) hypertension: Secondary | ICD-10-CM

## 2021-07-31 NOTE — Telephone Encounter (Signed)
Requested Prescriptions  Pending Prescriptions Disp Refills  . amLODipine (NORVASC) 5 MG tablet [Pharmacy Med Name: AMLODIPINE BESYLATE 5 MG TAB] 90 tablet 0    Sig: TAKE 1 TABLET (5 MG TOTAL) BY MOUTH DAILY.     Cardiovascular:  Calcium Channel Blockers Passed - 07/30/2021  4:19 PM      Passed - Last BP in normal range    BP Readings from Last 1 Encounters:  03/31/21 137/83         Passed - Valid encounter within last 6 months    Recent Outpatient Visits          5 months ago Essential hypertension   [facility] Reubin Milan, MD   5 months ago Essential hypertension   [facility] Reubin Milan, MD   1 year ago Sciatica, right side   [facility] Reubin Milan, MD   1 year ago Essential hypertension   [facility] Reubin Milan, MD

## 2021-09-17 ENCOUNTER — Other Ambulatory Visit: Payer: Self-pay | Admitting: Internal Medicine

## 2021-09-17 DIAGNOSIS — I10 Essential (primary) hypertension: Secondary | ICD-10-CM

## 2021-09-17 NOTE — Telephone Encounter (Signed)
Pt called, left VM to call office back to schedule an appt for HTN f/up and med refill. Pt had OV on 02/24/21 and was suppose to f/up in 1 month but no appt was made.

## 2021-09-17 NOTE — Telephone Encounter (Signed)
Courtesy refill  Requested Prescriptions  Pending Prescriptions Disp Refills   amLODipine (NORVASC) 5 MG tablet [Pharmacy Med Name: AMLODIPINE BESYLATE 5 MG TAB] 30 tablet 0    Sig: TAKE 1 TABLET (5 MG TOTAL) BY MOUTH DAILY.     Cardiovascular:  Calcium Channel Blockers Failed - 09/17/2021  9:44 AM      Failed - Valid encounter within last 6 months    Recent Outpatient Visits          6 months ago Essential hypertension   [facility] Reubin Milan, MD   7 months ago Essential hypertension   [facility] Reubin Milan, MD   1 year ago Sciatica, right side   [facility] Reubin Milan, MD   2 years ago Essential hypertension   [facility] Reubin Milan, MD             Passed - Last BP in normal range    BP Readings from Last 1 Encounters:  03/31/21 137/83

## 2021-10-19 ENCOUNTER — Other Ambulatory Visit: Payer: Self-pay

## 2021-10-19 ENCOUNTER — Encounter: Payer: Self-pay | Admitting: Internal Medicine

## 2021-10-19 ENCOUNTER — Ambulatory Visit: Payer: BC Managed Care – PPO | Admitting: Internal Medicine

## 2021-10-19 VITALS — BP 122/80 | HR 77 | Ht 64.0 in | Wt 161.2 lb

## 2021-10-19 DIAGNOSIS — E782 Mixed hyperlipidemia: Secondary | ICD-10-CM | POA: Insufficient documentation

## 2021-10-19 DIAGNOSIS — E559 Vitamin D deficiency, unspecified: Secondary | ICD-10-CM | POA: Diagnosis not present

## 2021-10-19 DIAGNOSIS — Z1231 Encounter for screening mammogram for malignant neoplasm of breast: Secondary | ICD-10-CM

## 2021-10-19 DIAGNOSIS — M5431 Sciatica, right side: Secondary | ICD-10-CM | POA: Diagnosis not present

## 2021-10-19 DIAGNOSIS — M62838 Other muscle spasm: Secondary | ICD-10-CM

## 2021-10-19 DIAGNOSIS — I10 Essential (primary) hypertension: Secondary | ICD-10-CM | POA: Diagnosis not present

## 2021-10-19 DIAGNOSIS — J4520 Mild intermittent asthma, uncomplicated: Secondary | ICD-10-CM | POA: Diagnosis not present

## 2021-10-19 DIAGNOSIS — Z1159 Encounter for screening for other viral diseases: Secondary | ICD-10-CM | POA: Diagnosis not present

## 2021-10-19 MED ORDER — ALBUTEROL SULFATE HFA 108 (90 BASE) MCG/ACT IN AERS: INHALATION_SPRAY | RESPIRATORY_TRACT | 1 refills | Status: DC

## 2021-10-19 MED ORDER — LISINOPRIL 20 MG PO TABS: 20.0000 mg | ORAL_TABLET | Freq: Every day | ORAL | 1 refills | Status: DC

## 2021-10-19 MED ORDER — IBUPROFEN 800 MG PO TABS: 800.0000 mg | ORAL_TABLET | Freq: Three times a day (TID) | ORAL | 0 refills | Status: DC | PRN

## 2021-10-19 MED ORDER — METAXALONE 800 MG PO TABS: 800.0000 mg | ORAL_TABLET | Freq: Three times a day (TID) | ORAL | 5 refills | Status: DC

## 2021-10-19 MED ORDER — AMLODIPINE BESYLATE 5 MG PO TABS: 5.0000 mg | ORAL_TABLET | Freq: Every day | ORAL | 1 refills | Status: DC

## 2021-10-19 NOTE — Progress Notes (Signed)
Date:  10/19/2021   Name:  Susan Durham   DOB:  07/17/1971   MRN:  [id number]   Chief Complaint: Hypertension  Hypertension This is a chronic problem. The problem is controlled. Associated symptoms include neck pain. Pertinent negatives include no chest pain, headaches, palpitations or shortness of breath. Past treatments include calcium channel blockers and ACE inhibitors (amlodipine added last June - no BP f/u since then). The current treatment provides significant improvement. There is no history of kidney disease, CAD/MI or CVA.  Shoulder Injury  The incident occurred at work. The right shoulder is affected. Incident onset: August 2022. The injury mechanism was a fall (she has been treated and followed by ORthopedics). Pertinent negatives include no chest pain.  Neck Pain  This is a new problem. The current episode started today. The pain is associated with a sleep position. The pain is present in the left side and midline. The quality of the pain is described as aching and burning. Pertinent negatives include no chest pain, headaches or weakness.   Lab Results  Component Value Date   NA 138 02/17/2021   K 4.4 02/17/2021   CO2 20 02/17/2021   GLUCOSE 96 02/17/2021   BUN 12 02/17/2021   CREATININE 0.59 02/17/2021   CALCIUM 10.0 02/17/2021   EGFR 110 02/17/2021   GFRNONAA 75 09/17/2019   No results found for: CHOL, HDL, LDLCALC, LDLDIRECT, TRIG, CHOLHDL Lab Results  Component Value Date   TSH 3.480 02/17/2021   No results found for: HGBA1C Lab Results  Component Value Date   WBC 8.2 02/17/2021   HGB 12.2 02/17/2021   HCT 38.1 02/17/2021   MCV 88 02/17/2021   PLT 266 02/17/2021   Lab Results  Component Value Date   ALT 25 02/17/2021   AST 21 02/17/2021   ALKPHOS 61 02/17/2021   BILITOT <0.2 02/17/2021   Lab Results  Component Value Date   VD25OH 22.1 (L) 09/17/2019     Review of Systems  Constitutional:  Negative for fatigue and unexpected weight change.   HENT:  Negative for nosebleeds.   Eyes:  Negative for visual disturbance.  Respiratory:  Negative for cough, chest tightness, shortness of breath and wheezing.   Cardiovascular:  Negative for chest pain, palpitations and leg swelling.  Gastrointestinal:  Negative for abdominal pain, constipation and diarrhea.  Musculoskeletal:  Positive for arthralgias and neck pain. Negative for gait problem and joint swelling.  Neurological:  Negative for dizziness, weakness, light-headedness and headaches.  Psychiatric/Behavioral:  Negative for dysphoric mood and sleep disturbance. The patient is not nervous/anxious.    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   Essential hypertension 07/07/2020   Sciatica, right side 07/07/2020   Carpal tunnel syndrome on right 07/07/2020   Mild intermittent asthma without complication 37/90/2409    Allergies  Allergen Reactions   Morphine And Related Anaphylaxis    Past Surgical History:  Procedure Laterality Date   ABDOMINAL HYSTERECTOMY      Social History   Tobacco Use   Smoking status: Former    Packs/day: 0.50    Years: 30.00    Pack years: 15.00    Types: Cigarettes    Quit date: 10/30/2018    Years since quitting: 2.9   Smokeless tobacco: Never  Vaping Use   Vaping Use: Never used  Substance Use Topics   Alcohol use: Never   Drug use: Never     Medication list  has been reviewed and updated.  Current Meds  Medication Sig   albuterol (VENTOLIN HFA) 108 (90 Base) MCG/ACT inhaler TAKE 2 PUFFS BY MOUTH EVERY 6 HOURS AS NEEDED FOR WHEEZE OR SHORTNESS OF BREATH   amLODipine (NORVASC) 5 MG tablet TAKE 1 TABLET (5 MG TOTAL) BY MOUTH DAILY.   estradiol (ESTRACE) 2 MG tablet Take 1 tablet (2 mg total) by mouth daily.   lisinopril (ZESTRIL) 20 MG tablet Take 1 tablet (20 mg total) by mouth daily.   meloxicam (MOBIC) 15 MG tablet TAKE 1 TABLET (15 MG TOTAL) BY MOUTH DAILY.    PHQ 2/9 Scores  10/19/2021 02/24/2021 02/17/2021 07/07/2020  PHQ - 2 Score 0 0 4 0  PHQ- 9 Score 0 3 7 0    GAD 7 : Generalized Anxiety Score 10/19/2021 02/24/2021 02/17/2021 07/07/2020  Nervous, Anxious, on Edge 0 1 2 0  Control/stop worrying 0 1 2 0  Worry too much - different things 0 1 2 0  Trouble relaxing 0 0 0 0  Restless 0 0 0 0  Easily annoyed or irritable 0 0 0 0  Afraid - awful might happen 0 0 0 0  Total GAD 7 Score 0 3 6 0  Anxiety Difficulty Not difficult at all - Not difficult at all -    BP Readings from Last 3 Encounters:  10/19/21 122/80  03/31/21 137/83  02/24/21 (!) 148/92    Physical Exam Vitals and nursing note reviewed.  Constitutional:      General: She is not in acute distress.    Appearance: Normal appearance. She is well-developed.  HENT:     Head: Normocephalic and atraumatic.  Neck:     Vascular: No carotid bruit.  Cardiovascular:     Rate and Rhythm: Normal rate and regular rhythm.     Pulses: Normal pulses.     Heart sounds: No murmur heard. Pulmonary:     Effort: Pulmonary effort is normal. No respiratory distress.     Breath sounds: No wheezing or rhonchi.  Musculoskeletal:     Cervical back: Normal range of motion. Spasms and tenderness present.     Lumbar back: Decreased range of motion.     Right lower leg: No edema.     Left lower leg: No edema.  Lymphadenopathy:     Cervical: No cervical adenopathy.  Skin:    General: Skin is warm and dry.     Findings: No rash.  Neurological:     General: No focal deficit present.     Mental Status: She is alert and oriented to person, place, and time.  Psychiatric:        Mood and Affect: Mood normal.        Behavior: Behavior normal.    Wt Readings from Last 3 Encounters:  10/19/21 161 lb 3.2 oz (73.1 kg)  03/31/21 168 lb (76.2 kg)  02/24/21 180 lb (81.6 kg)    BP 122/80    Pulse 77    Ht 5' 4" (1.626 m)    Wt 161 lb 3.2 oz (73.1 kg)    SpO2 98%    BMI 27.67 kg/m   Assessment and Plan: 1. Essential  hypertension Clinically stable exam with well controlled BP on lisinopril and amlodipine Tolerating medications without side effects at this time. Pt to continue current regimen and low sodium diet; benefits of regular exercise as able discussed.  - lisinopril (ZESTRIL) 20 MG tablet; Take 1 tablet (20 mg total) by mouth daily.  Dispense: 90 tablet; Refill: 1 - amLODipine (NORVASC) 5 MG tablet; Take 1 tablet (5 mg total) by mouth daily.  Dispense: 90 tablet; Refill: 1 - CBC with Differential/Platelet - Comprehensive metabolic panel - TSH  2. Muscle spasms of neck Continue heat; use Advil and skelaxin - ibuprofen (ADVIL) 800 MG tablet; Take 1 tablet (800 mg total) by mouth every 8 (eight) hours as needed.  Dispense: 30 tablet; Refill: 0 - metaxalone (SKELAXIN) 800 MG tablet; Take 1 tablet (800 mg total) by mouth 3 (three) times daily.  Dispense: 270 tablet; Refill: 5  3. Sciatica, right side Intermittent symptoms  4. Mild intermittent asthma without complication Mild intermittent wheezing with weather changes and pollen - albuterol (VENTOLIN HFA) 108 (90 Base) MCG/ACT inhaler; TAKE 2 PUFFS BY MOUTH EVERY 6 HOURS AS NEEDED FOR WHEEZE OR SHORTNESS OF BREATH  Dispense: 8.5 each; Refill: 1  5. Encounter for screening mammogram for breast cancer Schedule at [facility] - MM 3D SCREEN BREAST BILATERAL  6. Vitamin D deficiency Continue supplementation - VITAMIN D 25 Hydroxy (Vit-D Deficiency, Fractures)  7. Need for hepatitis C screening test - Hepatitis C antibody  8. Mixed hyperlipidemia Check labs and advise - Lipid panel - Hemoglobin A1c   Partially dictated using Editor, commissioning. Any errors are unintentional.  Halina Maidens, MD [facility]  10/19/2021

## 2021-10-20 LAB — COMPREHENSIVE METABOLIC PANEL
ALT: 24 IU/L (ref 0–32)
AST: 20 IU/L (ref 0–40)
Albumin/Globulin Ratio: 1.5 (ref 1.2–2.2)
Albumin: 4.2 g/dL (ref 3.8–4.9)
Alkaline Phosphatase: 68 IU/L (ref 44–121)
BUN/Creatinine Ratio: 18 (ref 9–23)
BUN: 12 mg/dL (ref 6–24)
Bilirubin Total: 0.2 mg/dL (ref 0.0–1.2)
CO2: 23 mmol/L (ref 20–29)
Calcium: 9.6 mg/dL (ref 8.7–10.2)
Chloride: 103 mmol/L (ref 96–106)
Creatinine, Ser: 0.66 mg/dL (ref 0.57–1.00)
Globulin, Total: 2.8 g/dL (ref 1.5–4.5)
Glucose: 103 mg/dL — ABNORMAL HIGH (ref 70–99)
Potassium: 4.5 mmol/L (ref 3.5–5.2)
Sodium: 140 mmol/L (ref 134–144)
Total Protein: 7 g/dL (ref 6.0–8.5)
eGFR: 106 mL/min/{1.73_m2} (ref >59)

## 2021-10-20 LAB — HEMOGLOBIN A1C
Est. average glucose Bld gHb Est-mCnc: 103 mg/dL
Hgb A1c MFr Bld: 5.2 % (ref 4.8–5.6)

## 2021-10-20 LAB — CBC WITH DIFFERENTIAL/PLATELET
Basophils Absolute: 0 10*3/uL (ref 0.0–0.2)
Basos: 1 %
EOS (ABSOLUTE): 0.2 10*3/uL (ref 0.0–0.4)
Eos: 4 %
Hematocrit: 40.5 % (ref 34.0–46.6)
Hemoglobin: 13.2 g/dL (ref 11.1–15.9)
Immature Grans (Abs): 0 10*3/uL (ref 0.0–0.1)
Immature Granulocytes: 0 %
Lymphocytes Absolute: 2 10*3/uL (ref 0.7–3.1)
Lymphs: 33 %
MCH: 28.7 pg (ref 26.6–33.0)
MCHC: 32.6 g/dL (ref 31.5–35.7)
MCV: 88 fL (ref 79–97)
Monocytes Absolute: 0.7 10*3/uL (ref 0.1–0.9)
Monocytes: 12 %
Neutrophils Absolute: 3 10*3/uL (ref 1.4–7.0)
Neutrophils: 50 %
Platelets: 279 10*3/uL (ref 150–450)
RBC: 4.6 x10E6/uL (ref 3.77–5.28)
RDW: 11.4 % — ABNORMAL LOW (ref 11.7–15.4)
WBC: 6 10*3/uL (ref 3.4–10.8)

## 2021-10-20 LAB — LIPID PANEL
Chol/HDL Ratio: 4.9 ratio — ABNORMAL HIGH (ref 0.0–4.4)
Cholesterol, Total: 182 mg/dL (ref 100–199)
HDL: 37 mg/dL — ABNORMAL LOW (ref >39)
LDL Chol Calc (NIH): 88 mg/dL (ref 0–99)
Triglycerides: 346 mg/dL — ABNORMAL HIGH (ref 0–149)
VLDL Cholesterol Cal: 57 mg/dL — ABNORMAL HIGH (ref 5–40)

## 2021-10-20 LAB — TSH: TSH: 3.25 u[IU]/mL (ref 0.450–4.500)

## 2021-10-20 LAB — VITAMIN D 25 HYDROXY (VIT D DEFICIENCY, FRACTURES): Vit D, 25-Hydroxy: 33.2 ng/mL (ref 30.0–100.0)

## 2021-10-20 LAB — HEPATITIS C ANTIBODY: Hep C Virus Ab: NONREACTIVE

## 2021-10-25 IMAGING — CR DG KNEE COMPLETE 4+V*R*
4 series · 4 of 4 positions shown · non-contrast
Comparison: None.

CLINICAL DATA: Fall today. Right shoulder and bilateral knee pain.

EXAM:
RIGHT KNEE - COMPLETE 4+ VIEW

[knee ap]
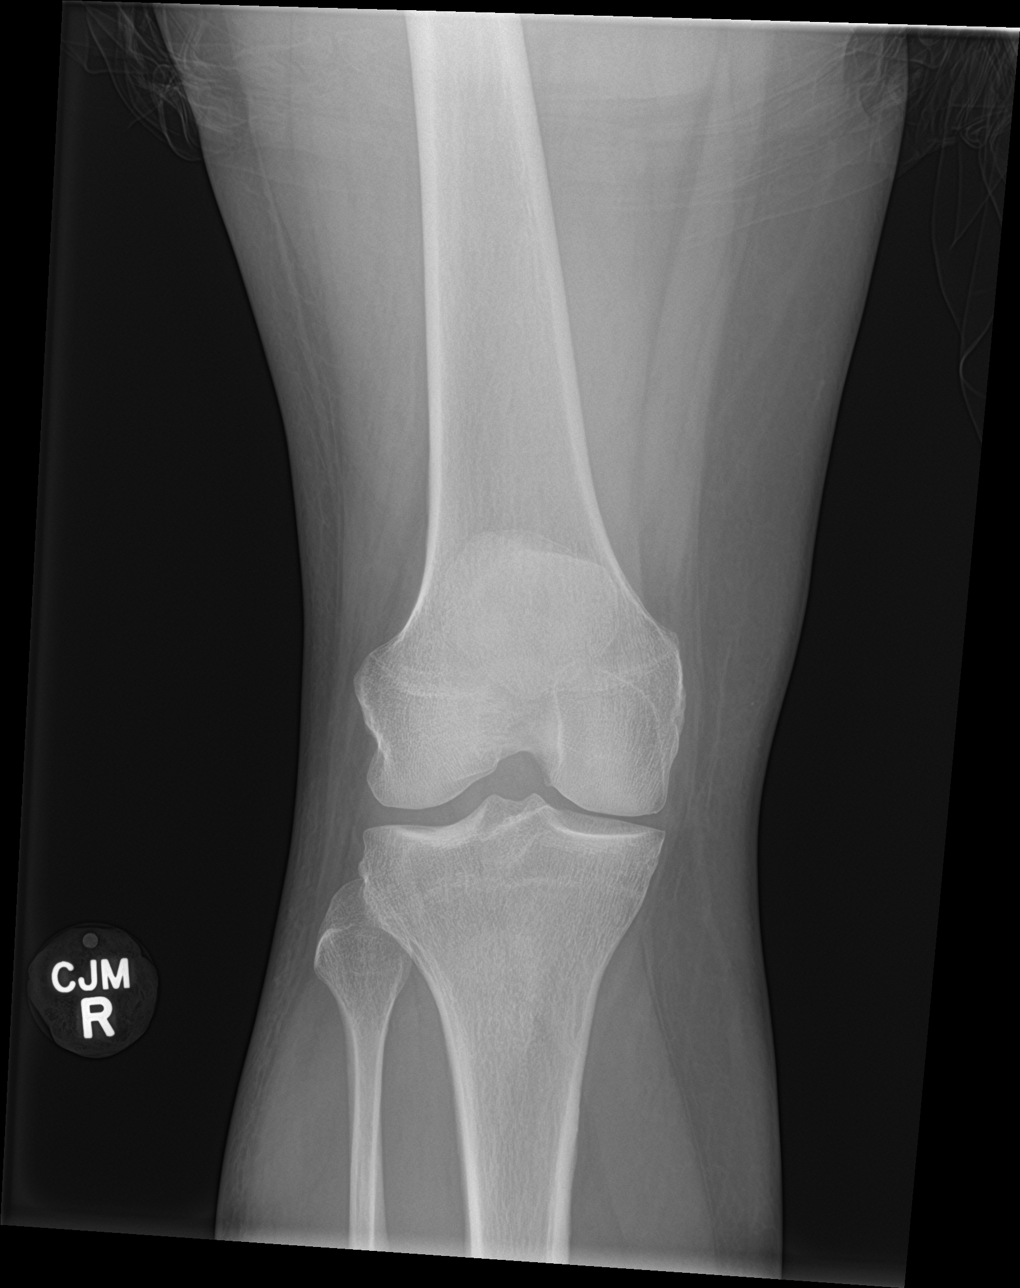

[knee obl (1 of 2)]
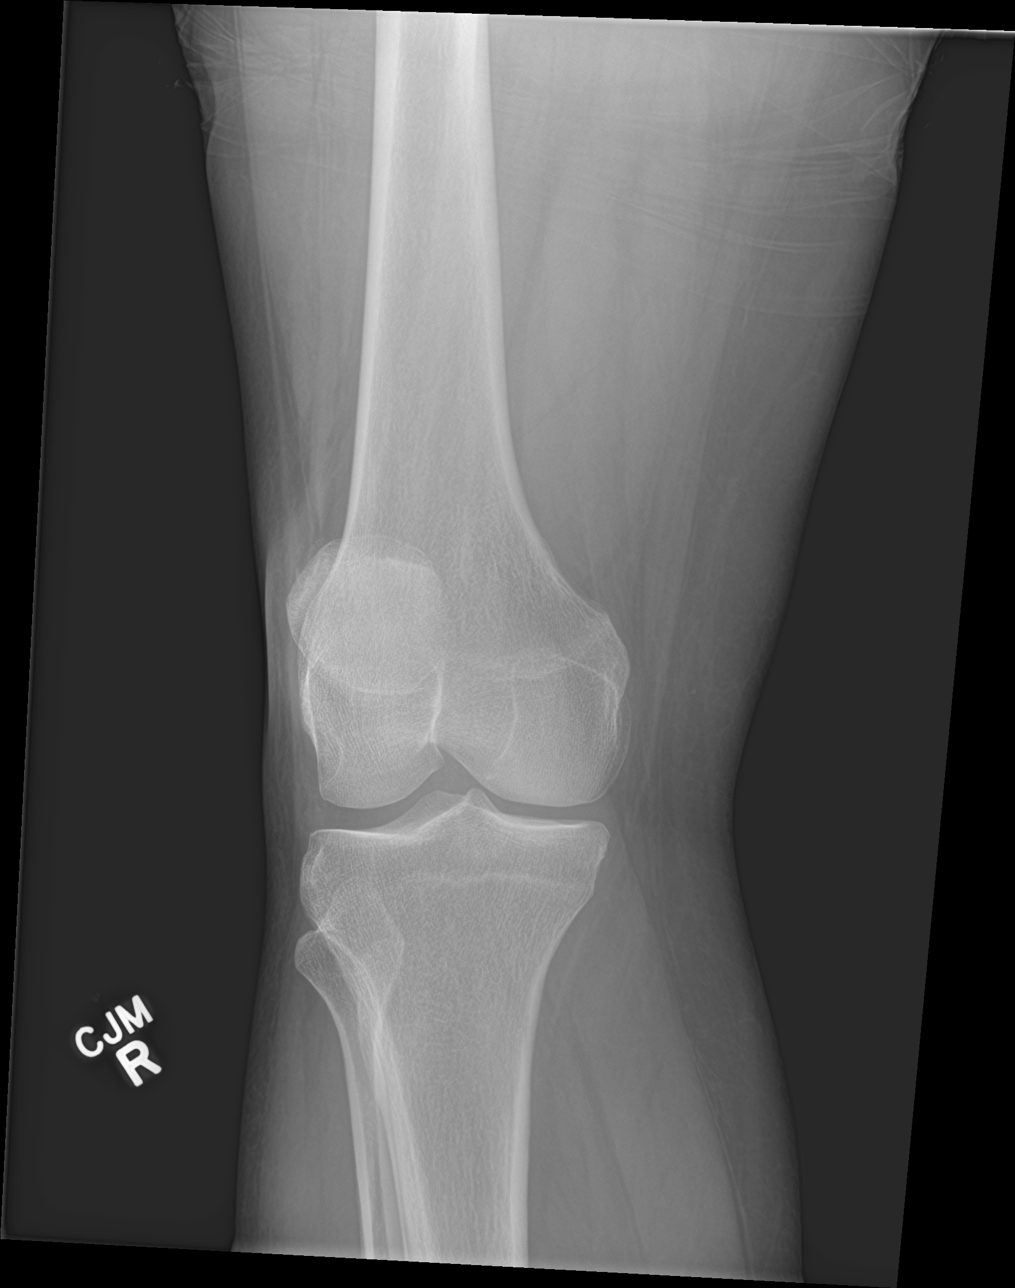

[knee obl (2 of 2)]
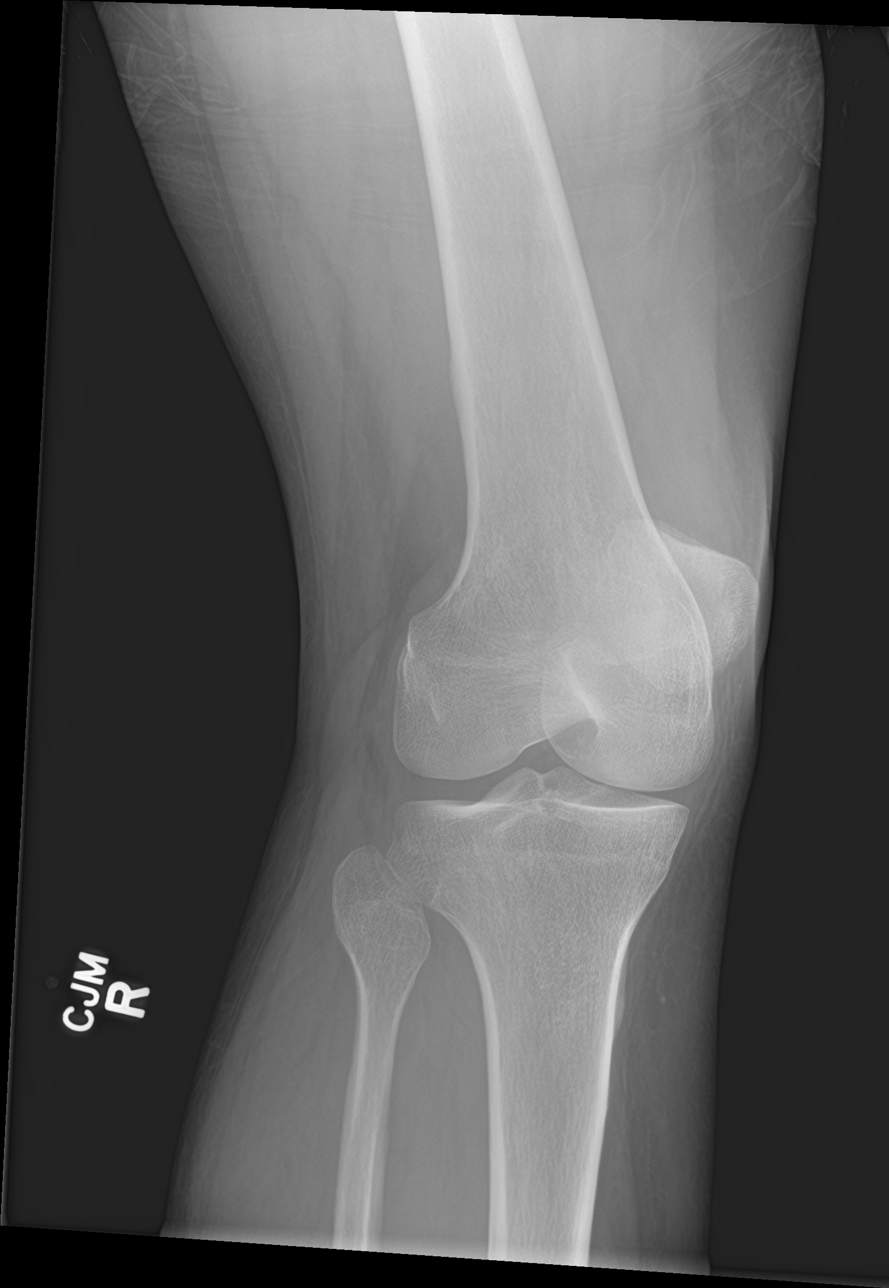

[knee lat]
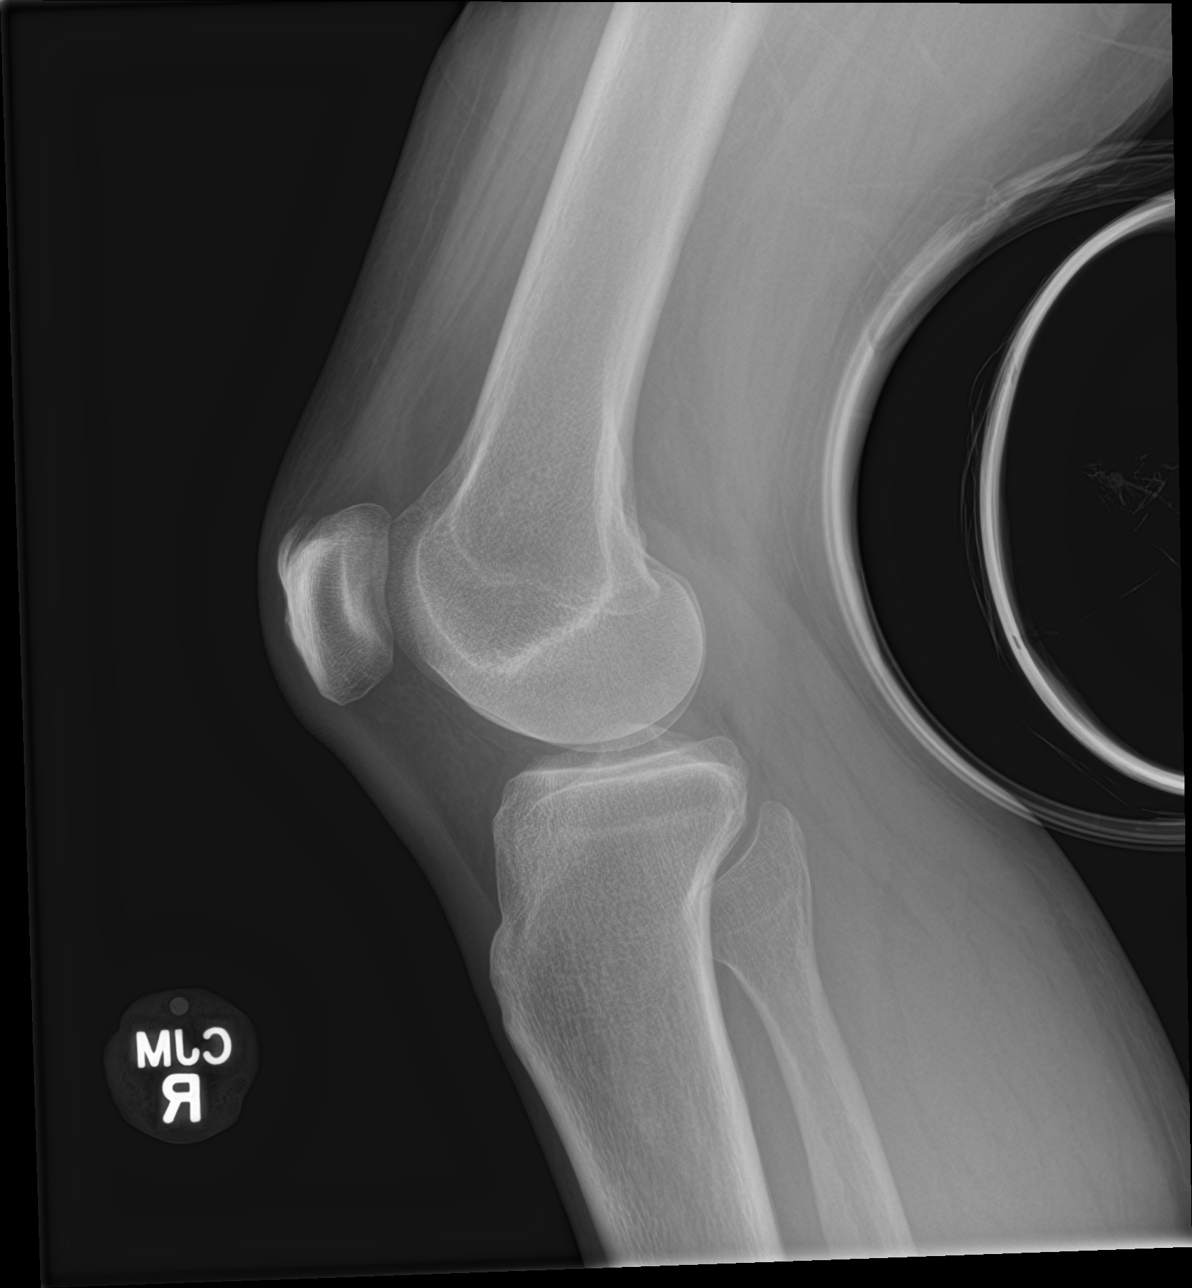

[4 of 4 positions shown; findings below may reference images not displayed]

FINDINGS: The mineralization and alignment are normal. There is no evidence of
acute fracture or dislocation. The joint spaces are preserved. No
joint effusion or focal soft tissue abnormality identified. There is
mild spurring at the quadriceps insertion on the patella.
IMPRESSION: No acute osseous findings.

## 2021-10-25 IMAGING — CR DG SHOULDER 2+V*R*
2 series · 2 of 2 positions shown · non-contrast
Comparison: None.

CLINICAL DATA: Fall today. Right shoulder and bilateral knee pain.

EXAM:
RIGHT SHOULDER - 2+ VIEW

[shoulder y view]
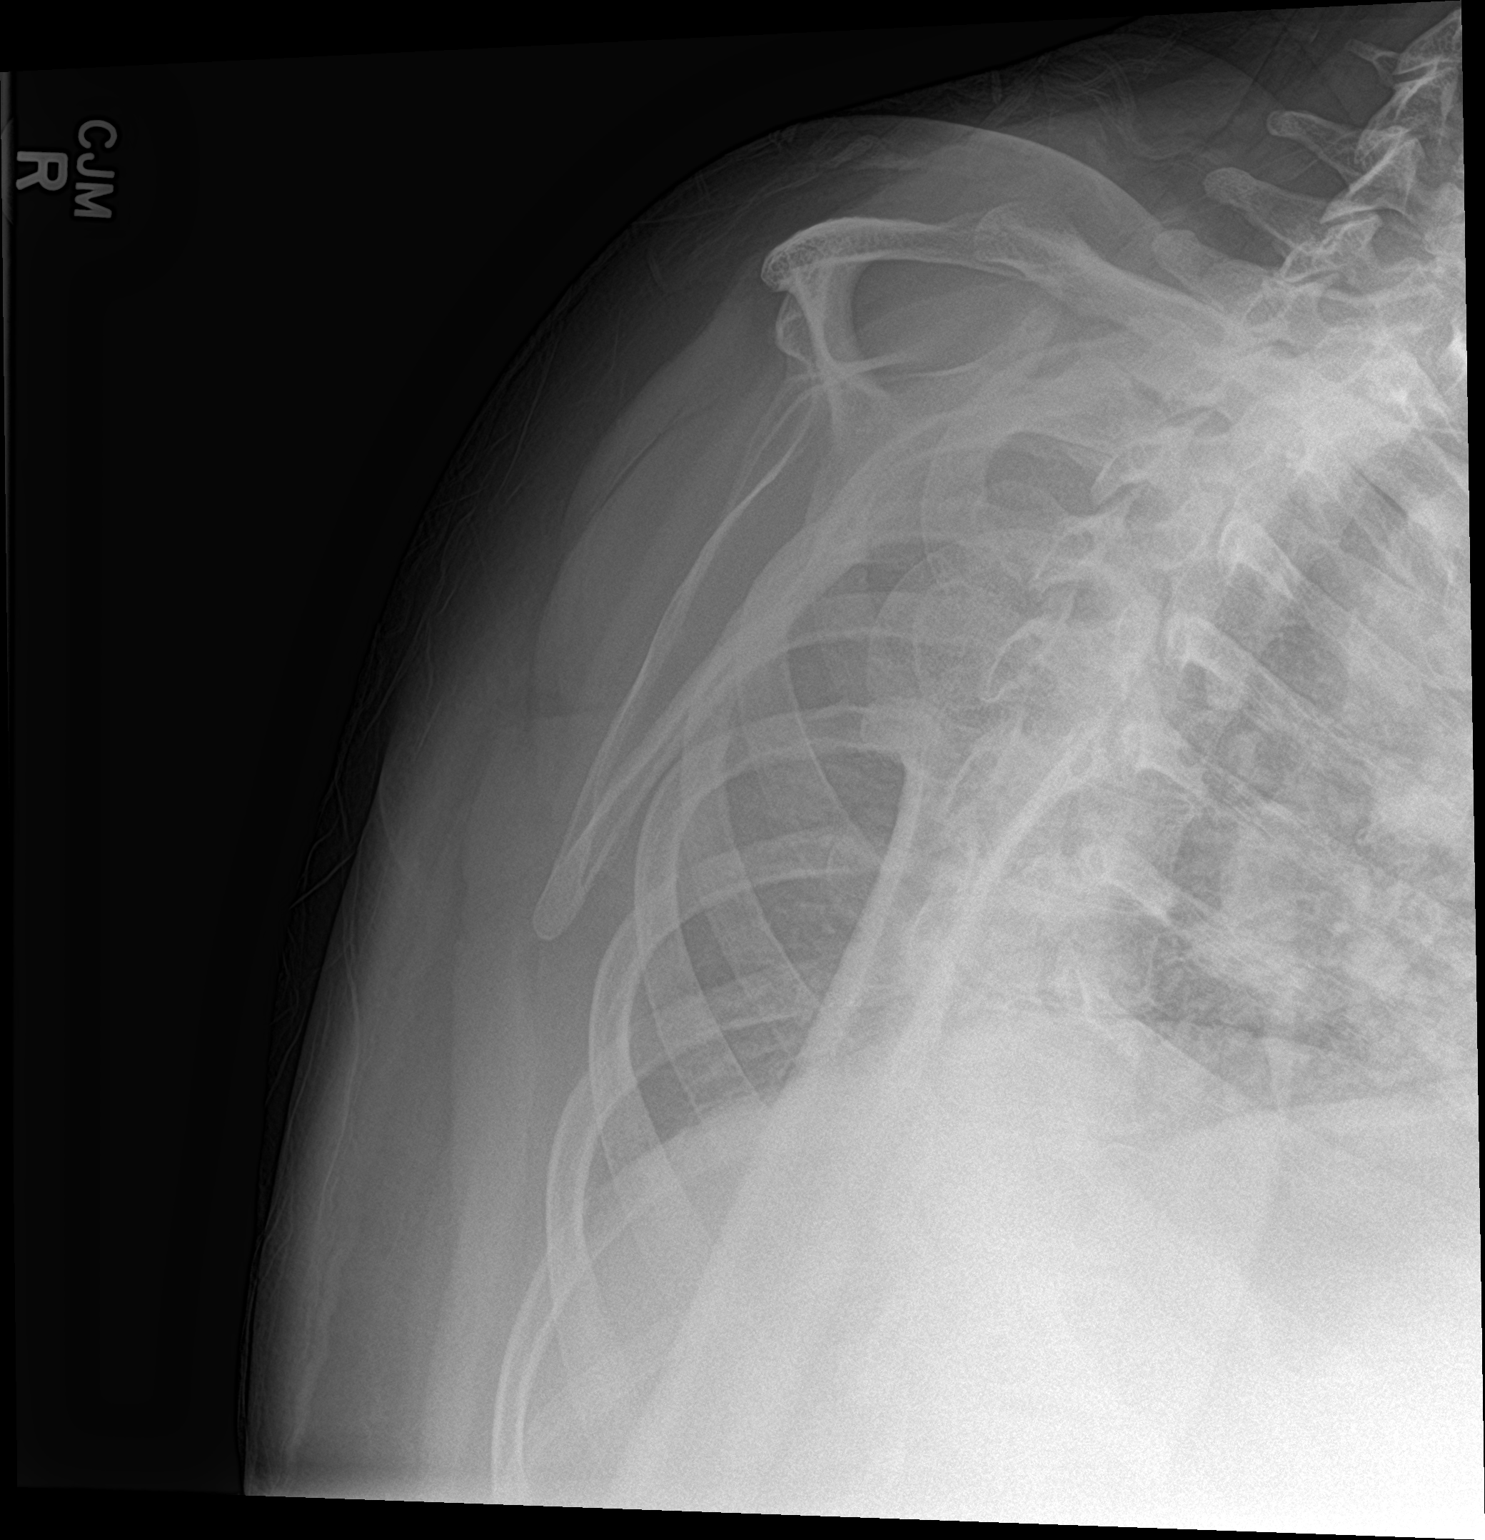

[shoulder ap neutral]
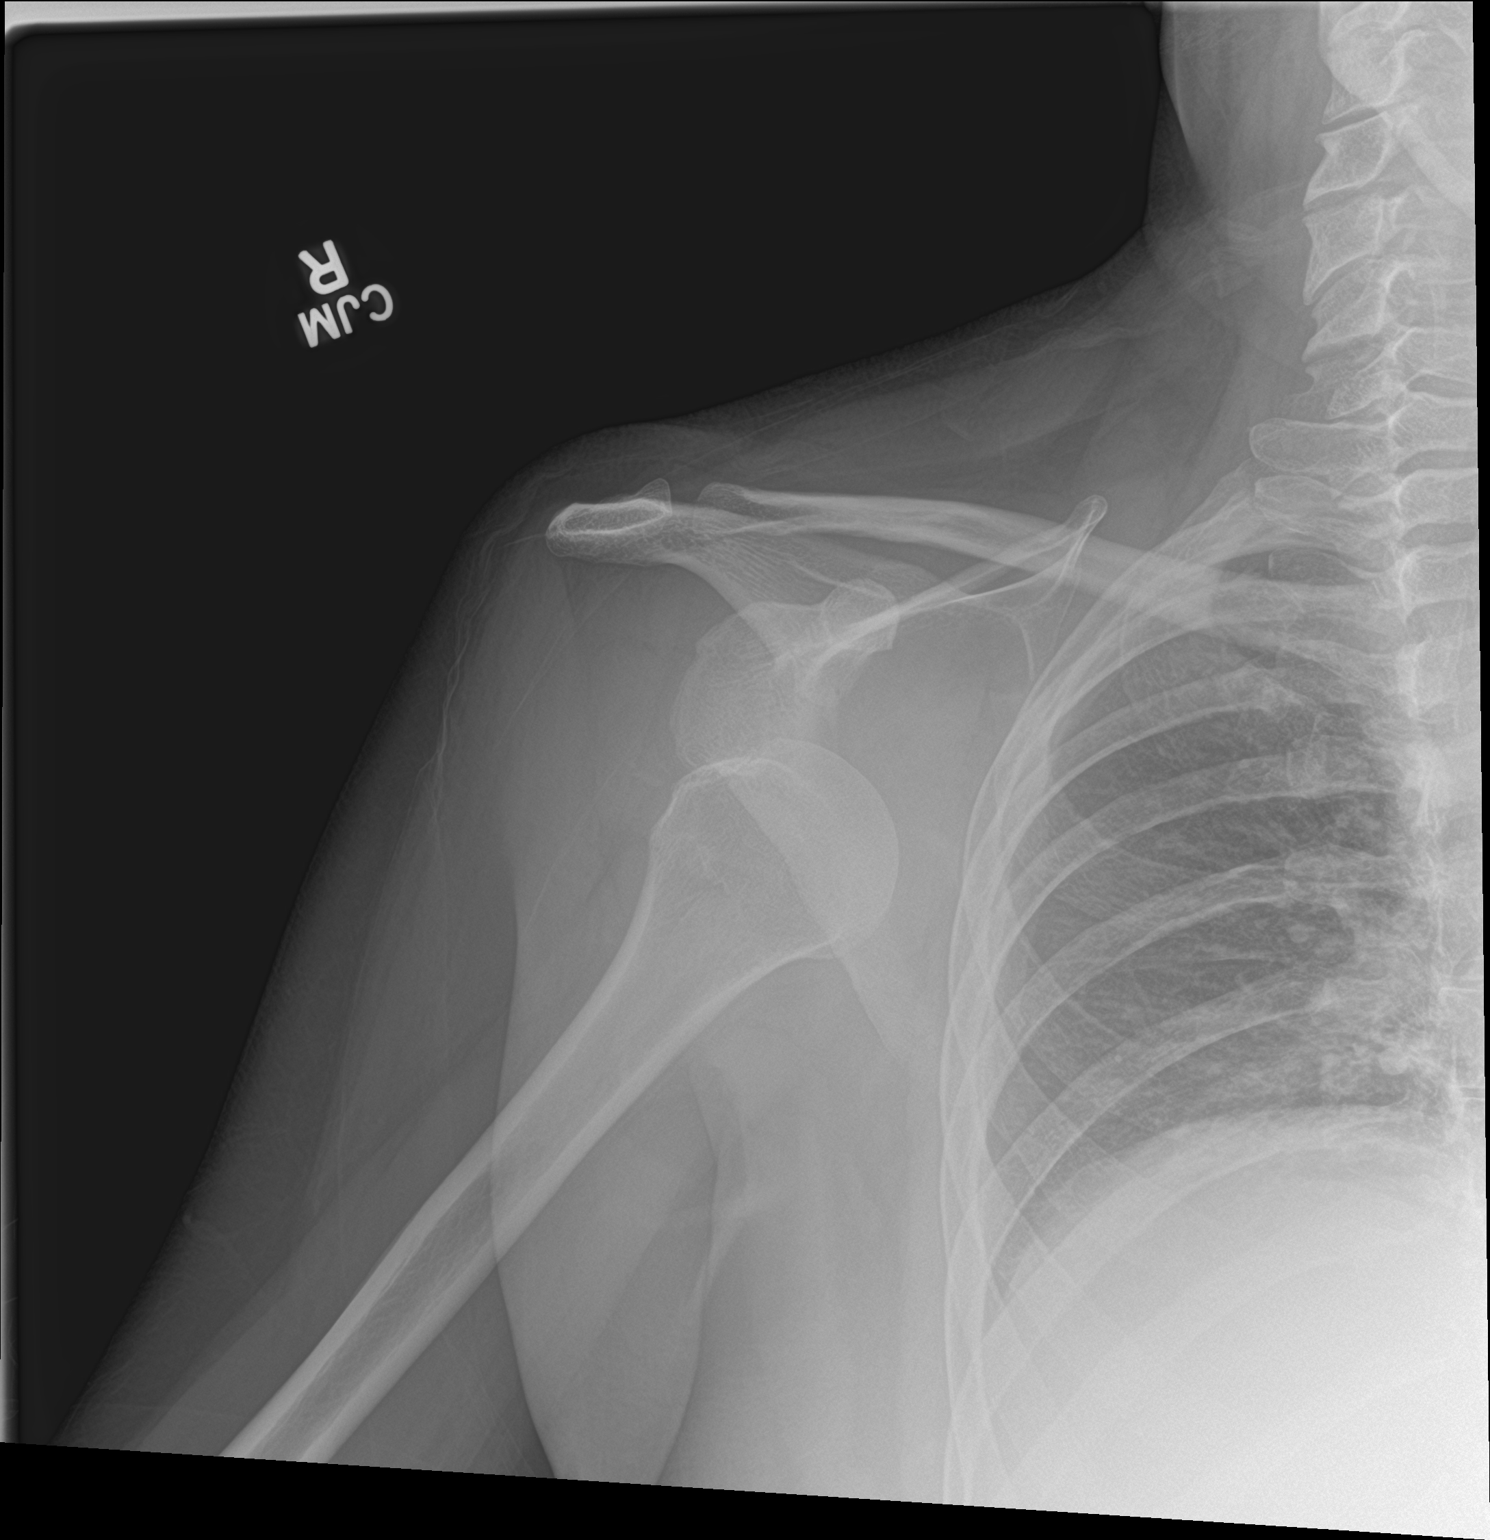

[2 of 2 positions shown; findings below may reference images not displayed]

FINDINGS: AP and Y-views are submitted. There is anterior inferior
glenohumeral dislocation. No definite acute fracture seen on these
views, although a mild Hill-Sachs deformity is difficult to exclude.
The distal clavicle and acromioclavicular joint appear intact.
IMPRESSION: Anterior inferior glenohumeral dislocation without definite
fracture. Recommend post reduction views.

## 2021-10-25 IMAGING — CT CT ELBOW*R* W/O CM
3 series · 15 of 33 positions shown, 18 images · non-contrast
Comparison: None.

CLINICAL DATA: Negative x-ray, right elbow pain

EXAM:
CT OF THE UPPER RIGHT EXTREMITY WITHOUT CONTRAST
TECHNIQUE: Multidetector CT imaging of the upper right extremity was performed
according to the standard protocol.

[Series 7: ax st · axial · 0.32mm/px · z∈[-219,-70]mm · 7 of 94 slices shown, 9 images]
[im 8/94  soft-tissue]
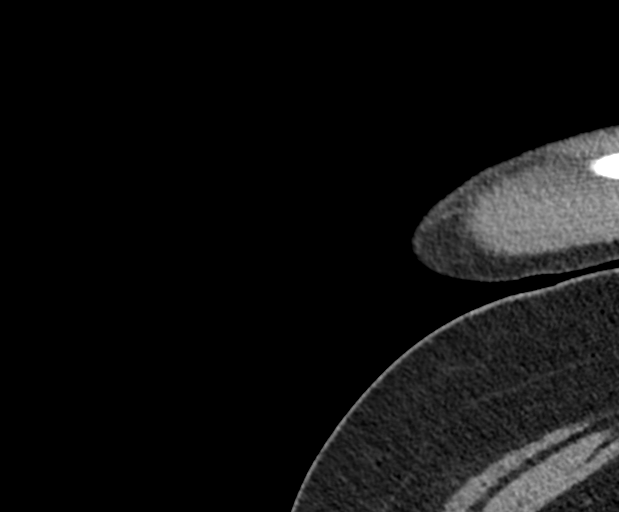
[im 8/94  bone]
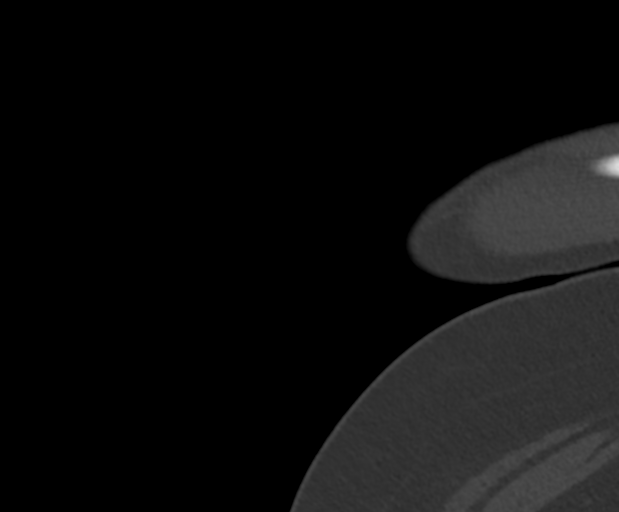
[im 22/94  bone]
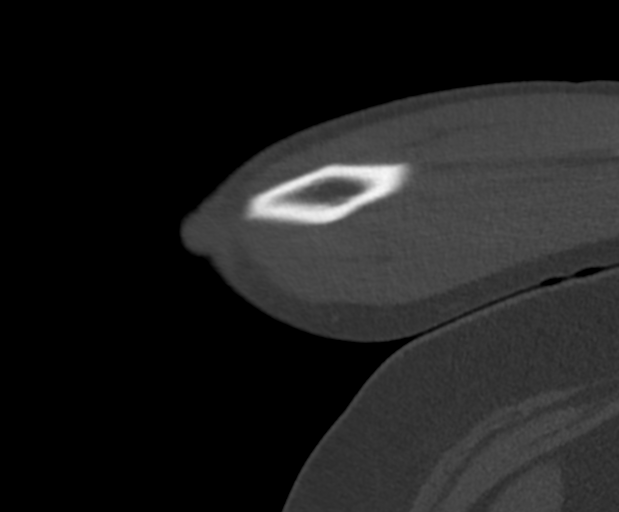
[im 36/94  bone]
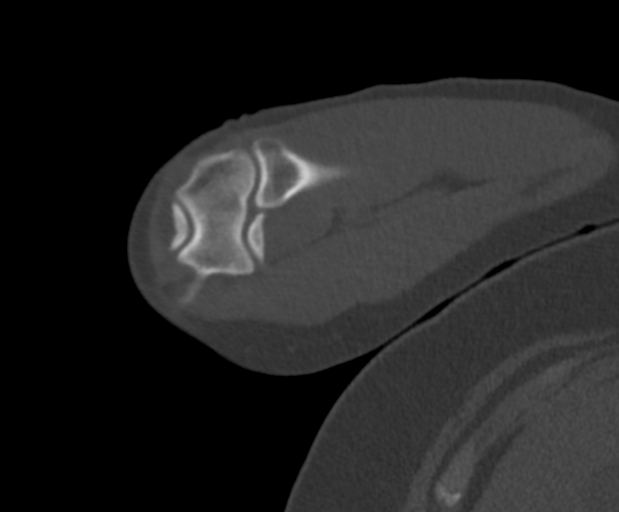
[im 51/94  bone]
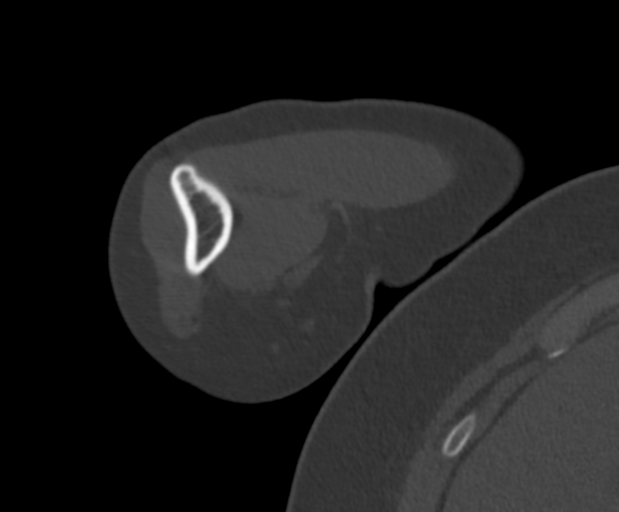
[im 58/94  soft-tissue]
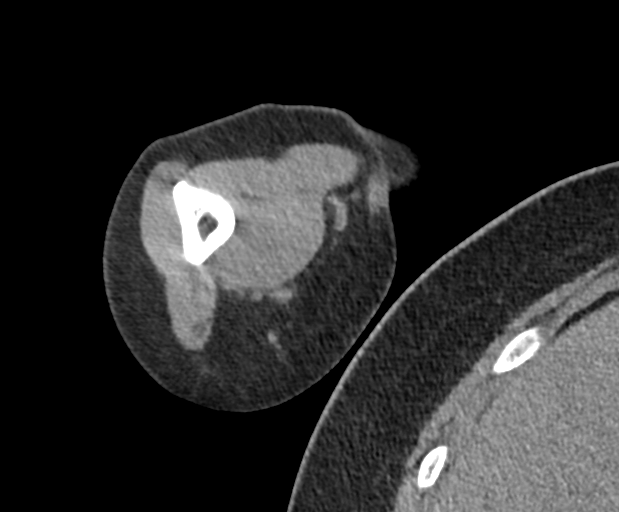
[im 58/94  bone]
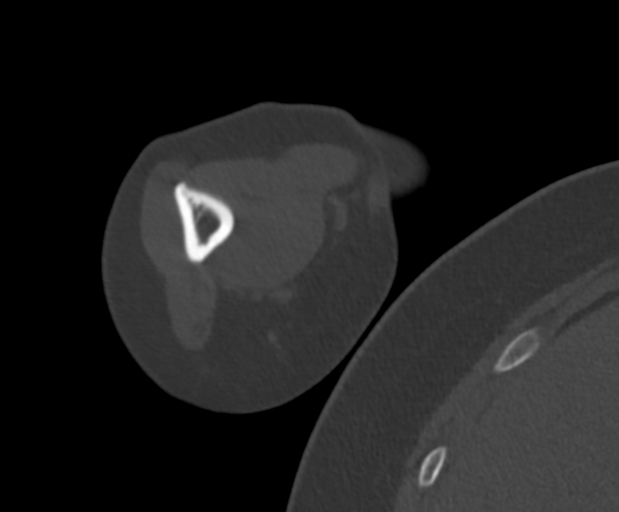
[im 72/94  bone]
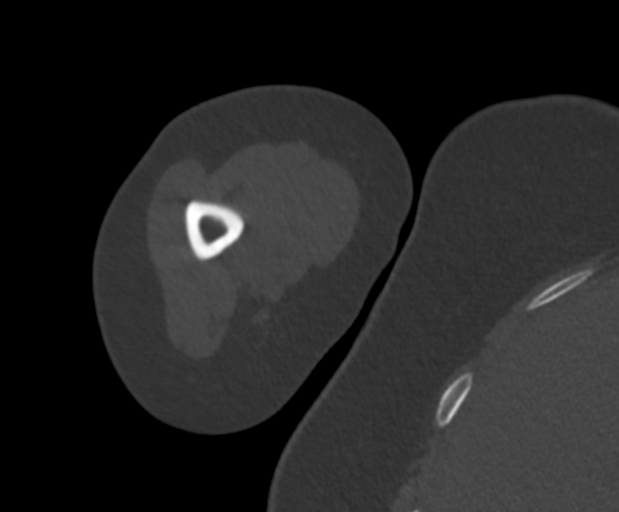
[im 86/94  bone]
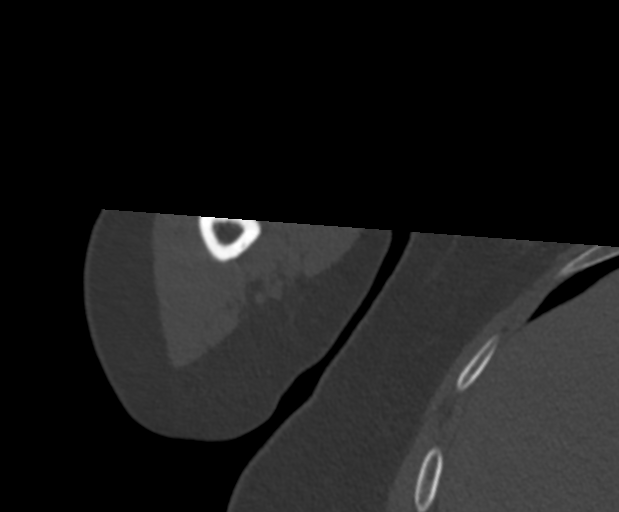

[Series 8: cor st · sagittal · 0.36mm/px · 5 of 105 slices shown, 6 images]
[im 35/105  bone]
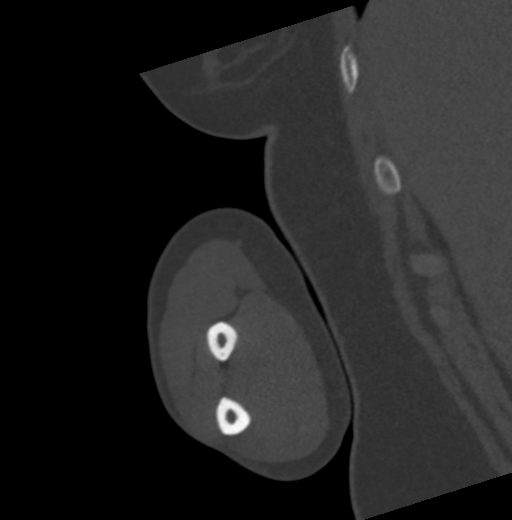
[im 44/105  bone]
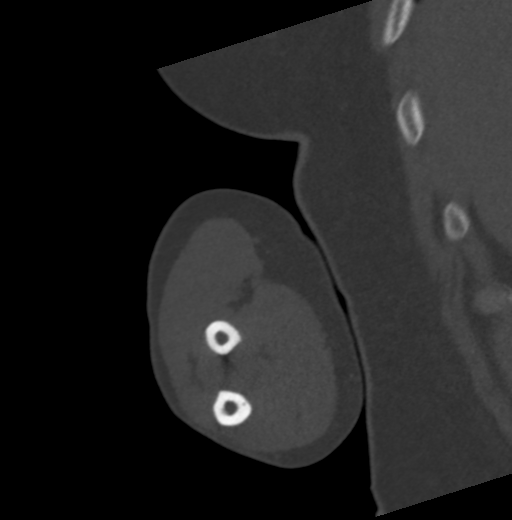
[im 53/105  soft-tissue]
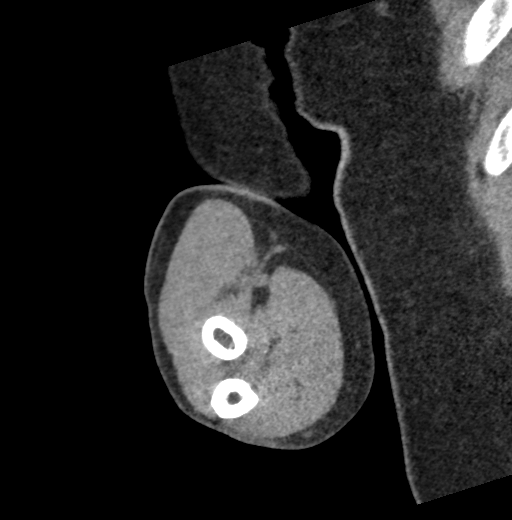
[im 53/105  bone]
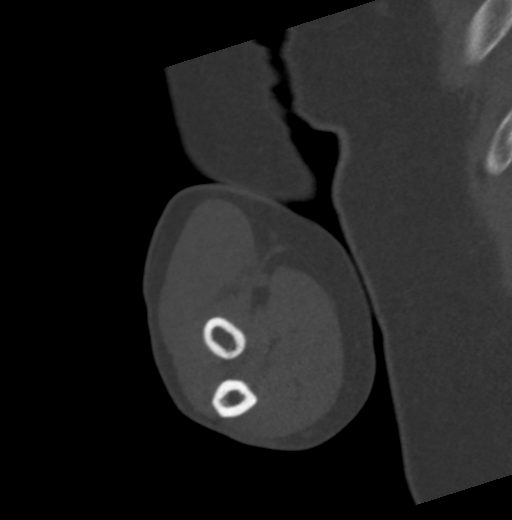
[im 61/105  bone]
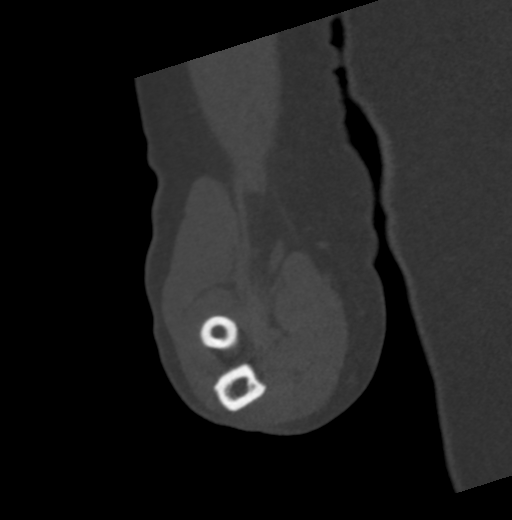
[im 70/105  bone]
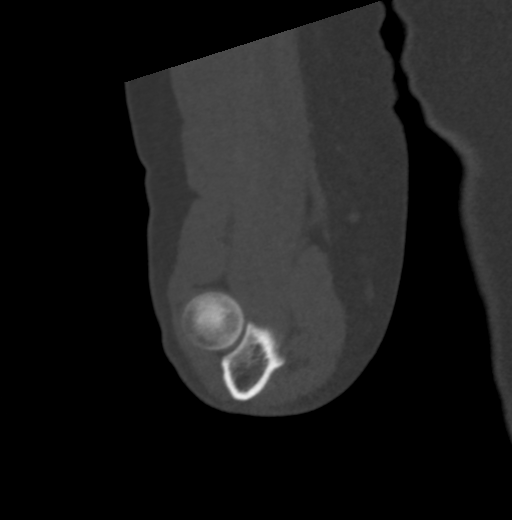

[Series 9: sag st · coronal · 0.37mm/px · 3 of 118 slices shown]
[im 29/118  bone]
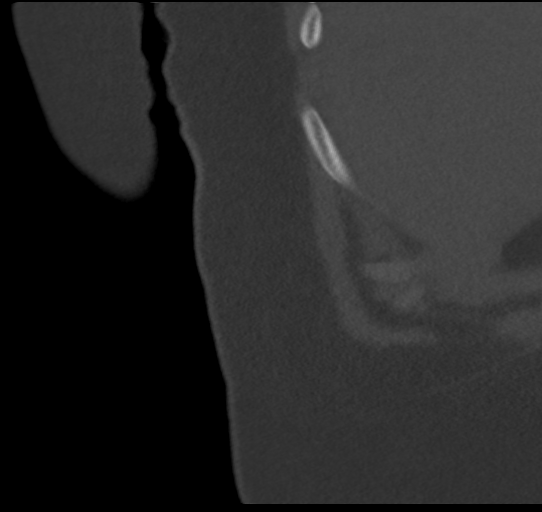
[im 49/118  bone]
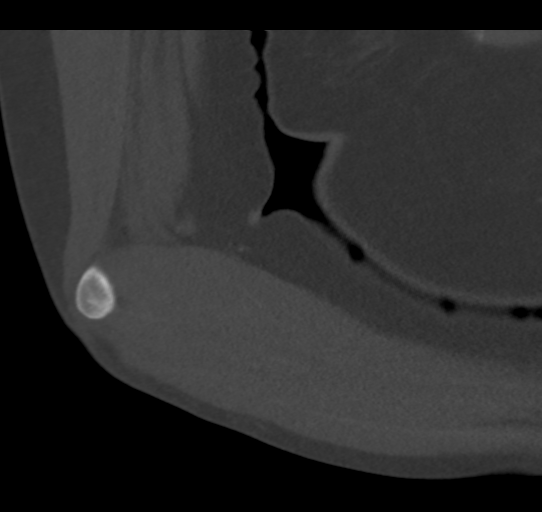
[im 69/118  bone]
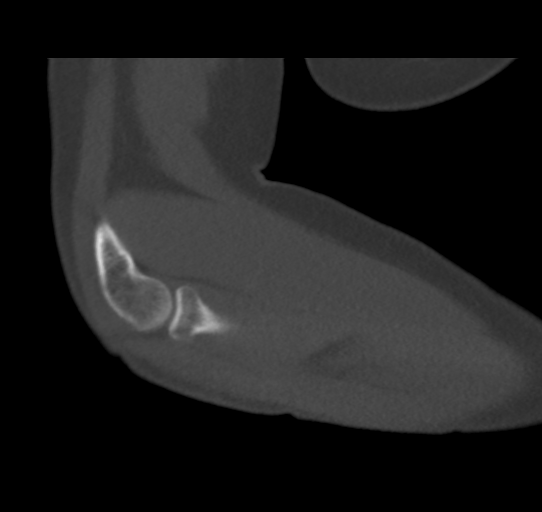

[15 of 33 positions shown; findings below may reference images not displayed]

FINDINGS: Bones/Joint/Cartilage

There is no evidence of acute fracture. There is no significant
degenerative change. There is no joint effusion.

Ligaments

Suboptimally assessed by CT.

Muscles and Tendons

No muscle atrophy.  No acute tendon tear on noncontrast CT.

Soft tissues

Unremarkable.
IMPRESSION: No evidence of acute fracture or joint effusion.

## 2021-10-25 IMAGING — DX DG SHOULDER 2+V PORT*R*
3 series · 3 of 3 positions shown · non-contrast
Comparison: 03/31/2021 at 6446 hours

CLINICAL DATA: Right shoulder dislocation status post reduction

EXAM:
PORTABLE RIGHT SHOULDER

[shoulder ap (1 of 3)]
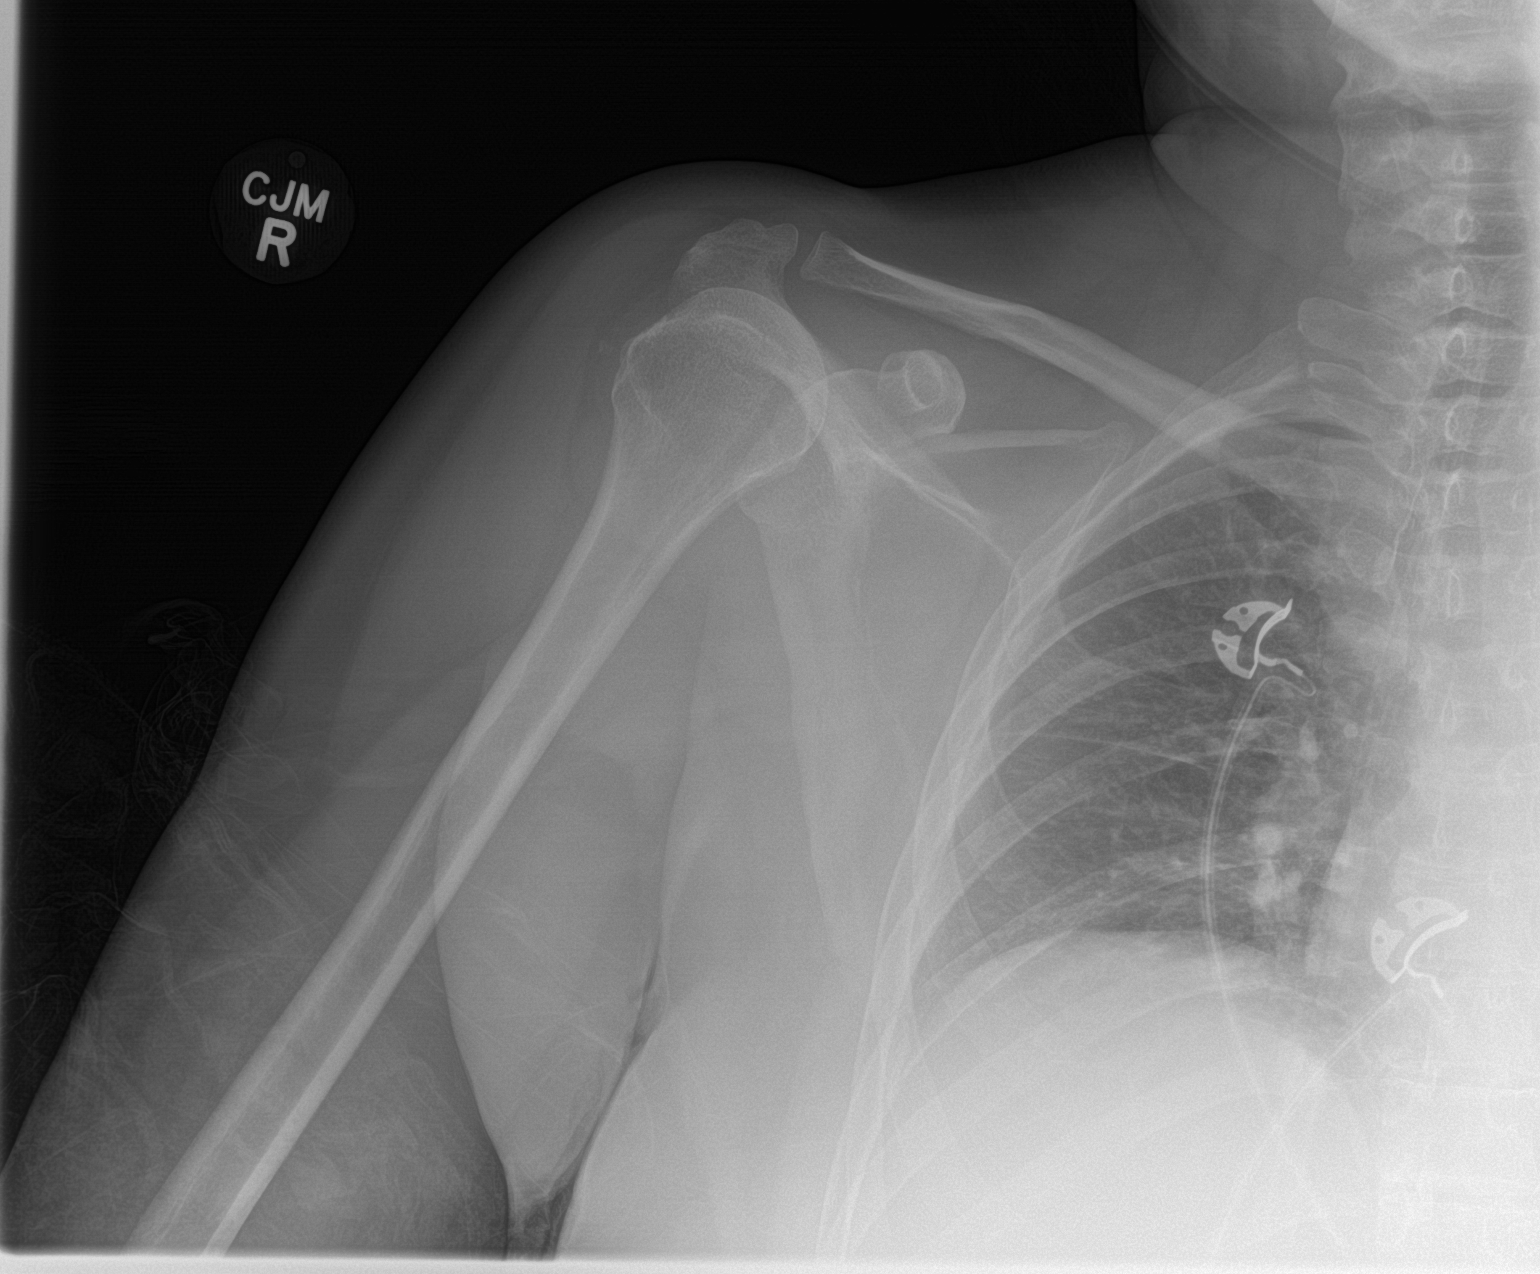

[shoulder ap (2 of 3)]
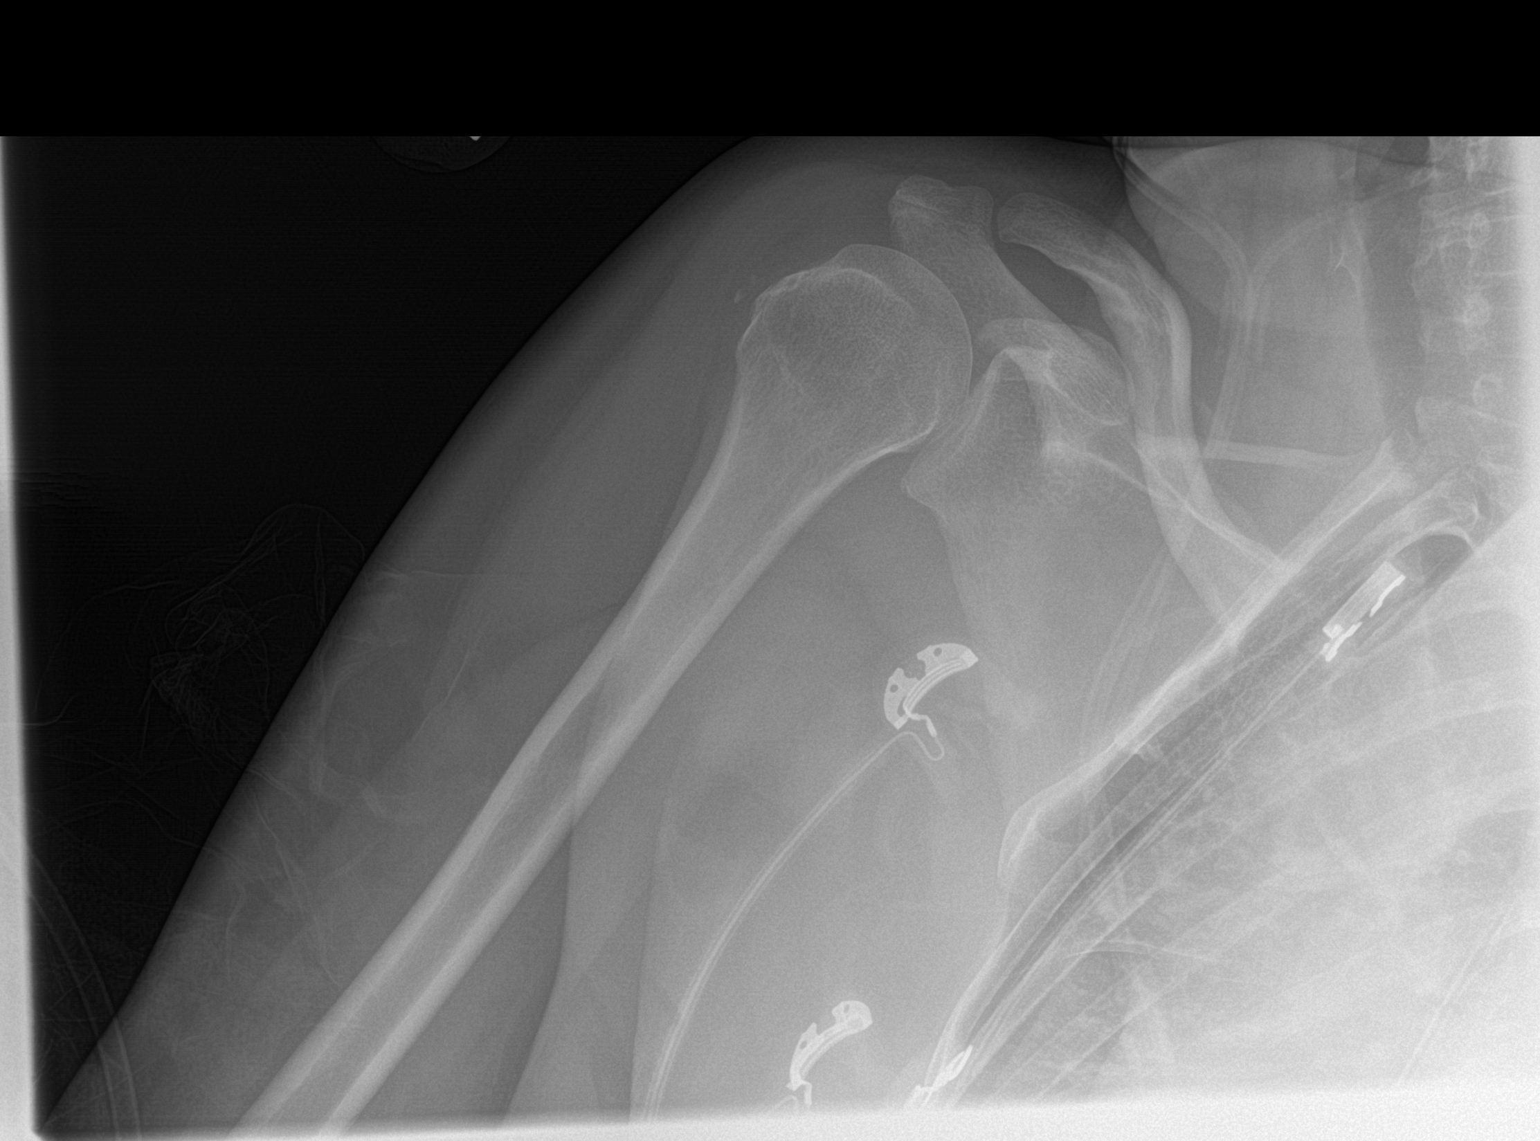

[shoulder ap (3 of 3)]
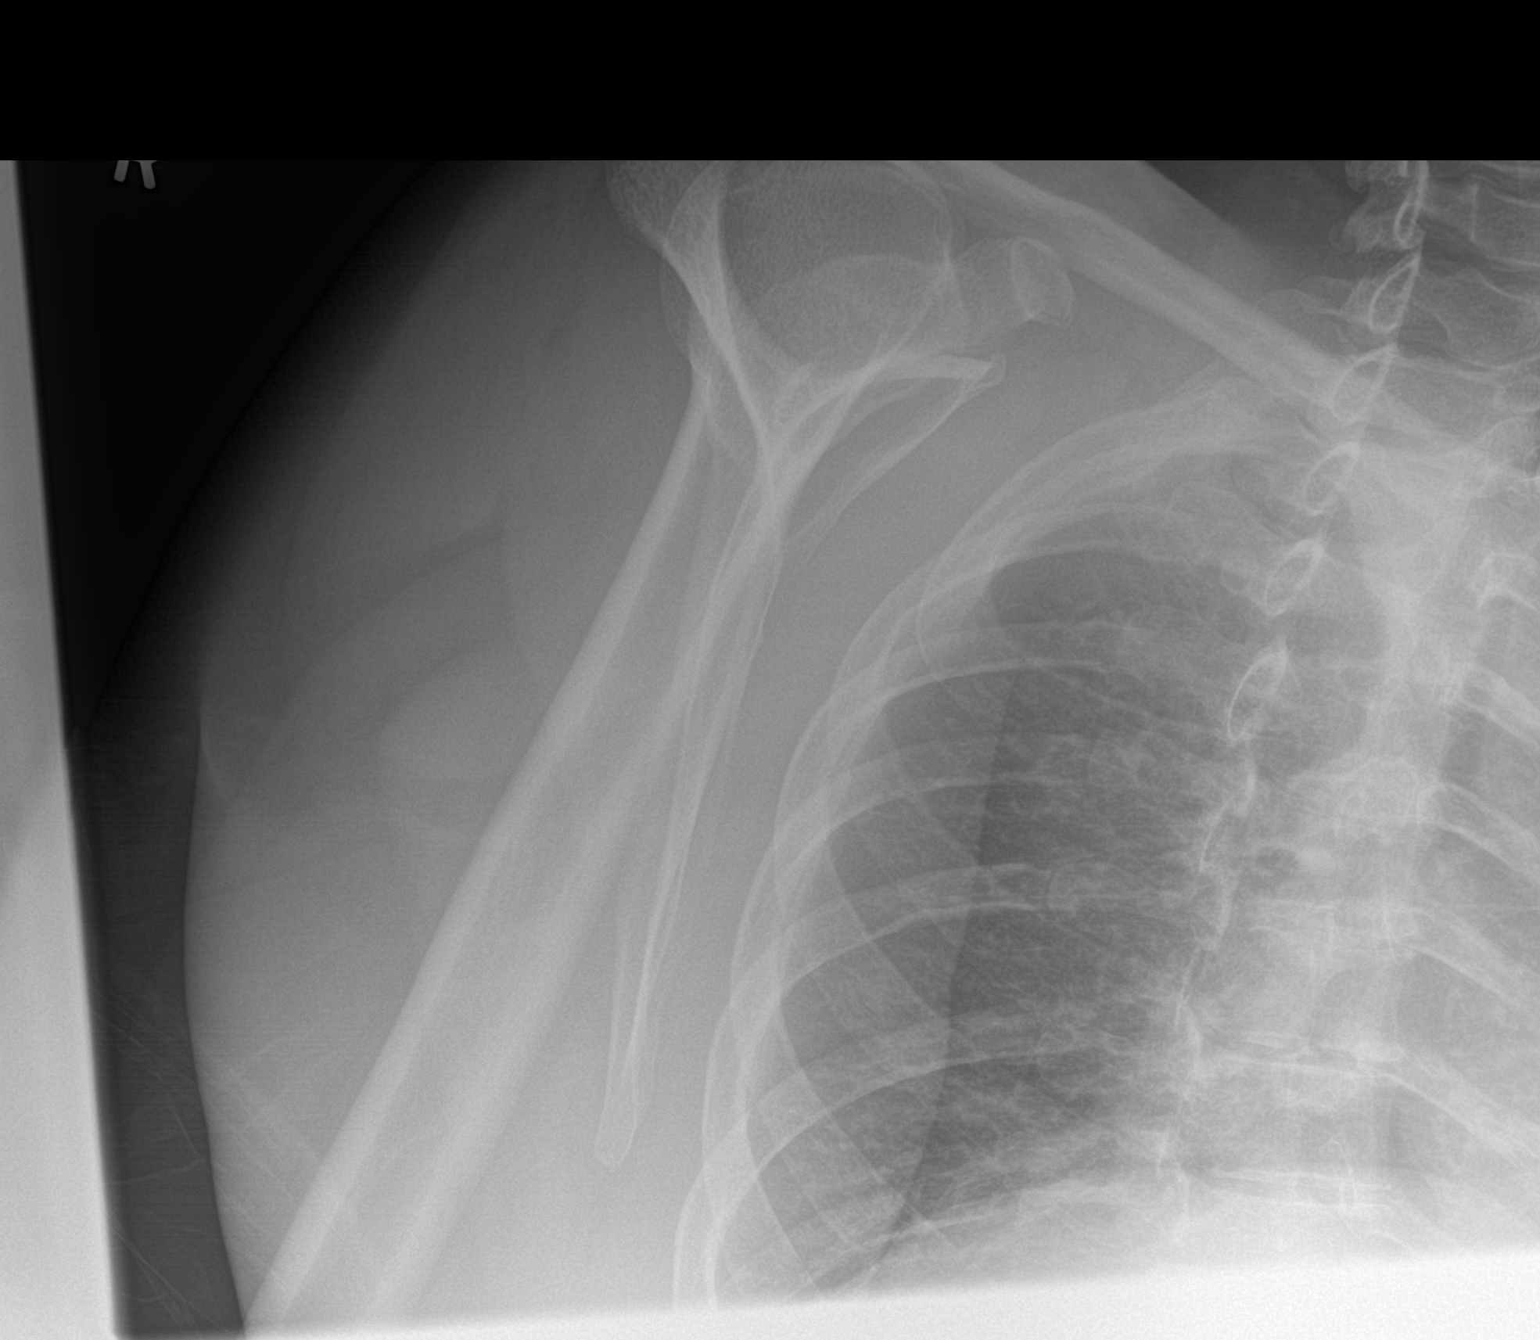

[3 of 3 positions shown; findings below may reference images not displayed]

FINDINGS: Successful reduction of a right glenohumeral joint dislocation. Two
tiny mineralized densities adjacent to the greater tuberosity which
may reflect tiny chip fracture fragments. Small Hill-Sachs impaction
deformity. No definite glenoid fracture is seen. AC joint intact and
unremarkable. Soft tissues appear within normal limits.
IMPRESSION: 1. Successful reduction of a right glenohumeral joint dislocation.
2. Small Hill-Sachs deformity. Two tiny mineralized densities
adjacent to the greater tuberosity which may reflect tiny chip
fracture fragments. No definite glenoid fracture.

## 2021-10-31 ENCOUNTER — Other Ambulatory Visit: Payer: Self-pay | Admitting: Internal Medicine

## 2021-10-31 DIAGNOSIS — N951 Menopausal and female climacteric states: Secondary | ICD-10-CM

## 2021-11-02 NOTE — Telephone Encounter (Signed)
Requested medication (s) are due for refill today:   Yes ? ?Requested medication (s) are on the active medication list:   Yes ? ?Future visit scheduled:   Yes     Seen 2 wks ago ? ? ?Last ordered: 02/17/2021 #90, 1 refill ? ?Returned because mammogram due per protocol.  ? ?Requested Prescriptions  ?Pending Prescriptions Disp Refills  ? estradiol (ESTRACE) 2 MG tablet [Pharmacy Med Name: ESTRADIOL 2 MG TABLET] 90 tablet 1  ?  Sig: TAKE 1 TABLET BY MOUTH EVERY DAY  ?  ? OB/GYN:  Estrogens Failed - 10/31/2021 11:07 AM  ?  ?  Failed - Mammogram is up-to-date per Health Maintenance  ?  ?  Passed - Last BP in normal range  ?  BP Readings from Last 1 Encounters:  ?10/19/21 122/80  ?  ?  ?  ?  Passed - Valid encounter within last 12 months  ?  Recent Outpatient Visits   ? ?      ? 2 weeks ago Essential hypertension  ? [facility] Glean Hess, MD  ? 8 months ago Essential hypertension  ? [facility] Glean Hess, MD  ? 8 months ago Essential hypertension  ? [facility] Glean Hess, [facility]  ? 1 year ago Sciatica, right side  ? [facility] Glean Hess, MD  ? 2 years ago Essential hypertension  ? [facility] Glean Hess, MD  ? ?  ?  ?Future Appointments   ? ?        ? In 5 months Army Melia Jesse Sans, MD [facility] [place]  ? ?  ? ?  ?  ?  ? ?

## 2021-12-09 ENCOUNTER — Ambulatory Visit
Admission: RE | Admit: 2021-12-09 | Discharge: 2021-12-09 | Disposition: A | Discharge status: Home / Self Care | Payer: BC Managed Care – PPO | Source: Ambulatory Visit | Attending: Internal Medicine | Admitting: Internal Medicine

## 2021-12-09 DIAGNOSIS — Z1231 Encounter for screening mammogram for malignant neoplasm of breast: Secondary | ICD-10-CM | POA: Insufficient documentation

## 2021-12-30 ENCOUNTER — Other Ambulatory Visit: Payer: Self-pay | Admitting: Internal Medicine

## 2021-12-30 DIAGNOSIS — R928 Other abnormal and inconclusive findings on diagnostic imaging of breast: Secondary | ICD-10-CM

## 2021-12-30 DIAGNOSIS — N6489 Other specified disorders of breast: Secondary | ICD-10-CM

## 2022-01-13 ENCOUNTER — Ambulatory Visit
Admission: RE | Admit: 2022-01-13 | Discharge: 2022-01-13 | Disposition: A | Discharge status: Home / Self Care | Payer: BC Managed Care – PPO | Source: Ambulatory Visit | Attending: Internal Medicine | Admitting: Internal Medicine

## 2022-01-13 DIAGNOSIS — N6489 Other specified disorders of breast: Secondary | ICD-10-CM

## 2022-01-13 DIAGNOSIS — R928 Other abnormal and inconclusive findings on diagnostic imaging of breast: Secondary | ICD-10-CM | POA: Diagnosis not present

## 2022-01-13 DIAGNOSIS — R922 Inconclusive mammogram: Secondary | ICD-10-CM | POA: Diagnosis not present

## 2022-01-14 ENCOUNTER — Telehealth: Payer: Self-pay | Admitting: Internal Medicine

## 2022-02-27 ENCOUNTER — Other Ambulatory Visit: Payer: Self-pay | Admitting: Internal Medicine

## 2022-02-27 DIAGNOSIS — I10 Essential (primary) hypertension: Secondary | ICD-10-CM

## 2022-02-28 ENCOUNTER — Other Ambulatory Visit: Payer: Self-pay

## 2022-02-28 DIAGNOSIS — I10 Essential (primary) hypertension: Secondary | ICD-10-CM

## 2022-02-28 DIAGNOSIS — N951 Menopausal and female climacteric states: Secondary | ICD-10-CM

## 2022-02-28 MED ORDER — ESTRADIOL 2 MG PO TABS: 2.0000 mg | ORAL_TABLET | Freq: Every day | ORAL | 0 refills | Status: DC

## 2022-02-28 MED ORDER — LISINOPRIL 20 MG PO TABS: 20.0000 mg | ORAL_TABLET | Freq: Every day | ORAL | 0 refills | Status: DC

## 2022-02-28 MED ORDER — AMLODIPINE BESYLATE 5 MG PO TABS: 5.0000 mg | ORAL_TABLET | Freq: Every day | ORAL | 0 refills | Status: DC

## 2022-04-22 ENCOUNTER — Encounter: Payer: BC Managed Care – PPO | Admitting: Internal Medicine

## 2022-05-29 ENCOUNTER — Other Ambulatory Visit: Payer: Self-pay | Admitting: Internal Medicine

## 2022-05-29 DIAGNOSIS — I10 Essential (primary) hypertension: Secondary | ICD-10-CM

## 2022-05-29 DIAGNOSIS — N951 Menopausal and female climacteric states: Secondary | ICD-10-CM

## 2022-05-30 NOTE — Telephone Encounter (Signed)
Requested Prescriptions  Pending Prescriptions Disp Refills  . lisinopril (ZESTRIL) 20 MG tablet [Pharmacy Med Name: LISINOPRIL 20 MG TABLET] 90 tablet 1    Sig: TAKE 1 TABLET BY MOUTH EVERY DAY     Cardiovascular:  ACE Inhibitors Failed - 05/29/2022  9:17 AM      Failed - Cr in normal range and within 180 days    Creatinine, Ser  Date Value Ref Range Status  10/19/2021 0.66 0.57 - 1.00 mg/dL Final         Failed - K in normal range and within 180 days    Potassium  Date Value Ref Range Status  10/19/2021 4.5 3.5 - 5.2 mmol/L Final         Failed - Valid encounter within last 6 months    Recent Outpatient Visits          7 months ago Essential hypertension   [place] [facility], Jesse Sans, MD   1 year ago Essential hypertension   [place] [facility] at [facility], Jesse Sans, MD   1 year ago Essential hypertension   [place] [facility] at [facility], Jesse Sans, MD   1 year ago Sciatica, right side   [place] [facility] at [facility], Jesse Sans, MD   2 years ago Essential hypertension   [place] [facility] at [facility], Jesse Sans, MD             Passed - Patient is not pregnant      Passed - Last BP in normal range    BP Readings from Last 1 Encounters:  10/19/21 122/80         . estradiol (ESTRACE) 2 MG tablet [Pharmacy Med Name: ESTRADIOL 2 MG TABLET] 90 tablet 0    Sig: TAKE 1 TABLET BY MOUTH EVERY DAY     OB/GYN:  Estrogens Passed - 05/29/2022  9:17 AM      Passed - Mammogram is up-to-date per Health Maintenance      Passed - Last BP in normal range    BP Readings from Last 1 Encounters:  10/19/21 122/80         Passed - Valid encounter within last 12 months    Recent Outpatient Visits          7 months ago Essential hypertension   [place] [facility] at [facility], Jesse Sans, MD   1 year ago Essential hypertension   [place] [facility] at [facility], Jesse Sans, MD   1 year ago Essential hypertension   [place] [facility] at [facility], Jesse Sans, MD   1 year ago Sciatica, right side    Primary [facility] and Sports Medicine at [facility], Jesse Sans, MD   2 years ago Essential hypertension   [place] and Sports Medicine at [facility], Jesse Sans, MD

## 2022-06-04 ENCOUNTER — Other Ambulatory Visit: Payer: Self-pay | Admitting: Internal Medicine

## 2022-06-04 DIAGNOSIS — I10 Essential (primary) hypertension: Secondary | ICD-10-CM

## 2022-06-06 ENCOUNTER — Ambulatory Visit: Payer: Self-pay | Admitting: *Deleted

## 2022-06-06 NOTE — Telephone Encounter (Signed)
Called pt - LMOMTCB for appointment. 

## 2022-06-06 NOTE — Telephone Encounter (Signed)
Pt called in to follow up on her refill request for Amlodipine. Scheduled pt a follow up apt for refill. Pt says that she seen provider this year and is not understanding why she has to see her again to have refill.   Pt is scheduled for providers next available 10/17.

## 2022-06-06 NOTE — Telephone Encounter (Signed)
Reason for Disposition  [1] Follow-up call to recent contact AND [2] information only call, no triage required  Answer Assessment - Initial Assessment Questions 1. REASON FOR CALL or QUESTION: "What is your reason for calling today?" or "How can I best help you?" or "What question do you have that I can help answer?"     Pt returned call and was given the message from Dr. Army Durham that 10 tablets of her amlodipine 5 mg had been sent in to last until her appt. On 06/14/2022.     No other questions.  Protocols used: Information Only Call - No Triage-A-AH

## 2022-06-06 NOTE — Telephone Encounter (Signed)
Noted  KP 

## 2022-06-06 NOTE — Telephone Encounter (Signed)
Called pt left VM to call back. 10 tablets have been sent in to last until appt.  KP

## 2022-06-06 NOTE — Telephone Encounter (Signed)
Requested medications are due for refill today.  yes  Requested medications are on the active medications list.  yes  Last refill. 02/28/2022 #60 0 rf  Future visit scheduled.   no  Notes to clinic.  Called pt - LMOMTCB for appt. Pharmacy needs dx code.    Requested Prescriptions  Pending Prescriptions Disp Refills   amLODipine (NORVASC) 5 MG tablet [Pharmacy Med Name: AMLODIPINE BESYLATE 5 MG TAB] 90 tablet 1    Sig: Take 1 tablet (5 mg total) by mouth daily.     Cardiovascular: Calcium Channel Blockers 2 Failed - 06/04/2022 12:36 PM      Failed - Valid encounter within last 6 months    Recent Outpatient Visits           7 months ago Essential hypertension   [place] [facility] at [facility], Jesse Sans, MD   1 year ago Essential hypertension   [place] [facility] at [facility], Jesse Sans, MD   1 year ago Essential hypertension   [place] [facility] at [facility], Jesse Sans, MD   1 year ago Sciatica, right side   [place] [facility] at [facility], Jesse Sans, MD   2 years ago Essential hypertension    [facility] and [facility] Medicine at [facility], Jesse Sans, MD              Passed - Last BP in normal range    BP Readings from Last 1 Encounters:  10/19/21 122/80         Passed - Last Heart Rate in normal range    Pulse Readings from Last 1 Encounters:  10/19/21 77

## 2022-06-14 ENCOUNTER — Ambulatory Visit: Payer: BC Managed Care – PPO | Admitting: Internal Medicine

## 2022-06-14 ENCOUNTER — Encounter: Payer: Self-pay | Admitting: Internal Medicine

## 2022-06-14 VITALS — BP 112/72 | HR 67 | Ht 64.0 in | Wt 172.0 lb

## 2022-06-14 DIAGNOSIS — N951 Menopausal and female climacteric states: Secondary | ICD-10-CM

## 2022-06-14 DIAGNOSIS — M7061 Trochanteric bursitis, right hip: Secondary | ICD-10-CM | POA: Diagnosis not present

## 2022-06-14 DIAGNOSIS — M7062 Trochanteric bursitis, left hip: Secondary | ICD-10-CM

## 2022-06-14 DIAGNOSIS — Z23 Encounter for immunization: Secondary | ICD-10-CM | POA: Diagnosis not present

## 2022-06-14 DIAGNOSIS — I10 Essential (primary) hypertension: Secondary | ICD-10-CM | POA: Diagnosis not present

## 2022-06-14 DIAGNOSIS — J4520 Mild intermittent asthma, uncomplicated: Secondary | ICD-10-CM

## 2022-06-14 DIAGNOSIS — E782 Mixed hyperlipidemia: Secondary | ICD-10-CM | POA: Diagnosis not present

## 2022-06-14 DIAGNOSIS — Z1211 Encounter for screening for malignant neoplasm of colon: Secondary | ICD-10-CM

## 2022-06-14 MED ORDER — ALBUTEROL SULFATE HFA 108 (90 BASE) MCG/ACT IN AERS: INHALATION_SPRAY | RESPIRATORY_TRACT | 1 refills | Status: DC

## 2022-06-14 MED ORDER — MELOXICAM 15 MG PO TABS: 15.0000 mg | ORAL_TABLET | Freq: Every day | ORAL | 0 refills | Status: DC

## 2022-06-14 MED ORDER — HYDROCHLOROTHIAZIDE 12.5 MG PO TABS: 12.5000 mg | ORAL_TABLET | Freq: Every day | ORAL | 1 refills | Status: DC

## 2022-06-14 NOTE — Progress Notes (Signed)
Date:  06/14/2022   Name:  Susan Durham   DOB:  21-Dec-1970   MRN:  [id number]   Chief Complaint: Hypertension  Hypertension This is a chronic problem. The problem is controlled. Pertinent negatives include no chest pain, headaches, palpitations or shortness of breath. Past treatments include ACE inhibitors and calcium channel blockers. The current treatment provides significant improvement. There are no compliance problems.  There is no history of kidney disease, CAD/MI or CVA.  Hyperlipidemia This is a chronic problem. Recent lipid tests were reviewed and are high (Triglycerides). Associated symptoms include myalgias (muscle cramps at night). Pertinent negatives include no chest pain or shortness of breath. Current antihyperlipidemic treatment includes diet change.  Hip Pain  There was no injury mechanism. The pain is present in the left hip and right hip. Quality: stiffness and aching. The pain is mild. The pain has been Fluctuating since onset. The symptoms are aggravated by movement and palpation. She has tried NSAIDs (intermittent Ibuprofen) for the symptoms.    Lab Results  Component Value Date   NA 140 10/19/2021   K 4.5 10/19/2021   CO2 23 10/19/2021   GLUCOSE 103 (H) 10/19/2021   BUN 12 10/19/2021   CREATININE 0.66 10/19/2021   CALCIUM 9.6 10/19/2021   EGFR 106 10/19/2021   GFRNONAA 75 09/17/2019   Lab Results  Component Value Date   CHOL 182 10/19/2021   HDL 37 (L) 10/19/2021   LDLCALC 88 10/19/2021   TRIG 346 (H) 10/19/2021   CHOLHDL 4.9 (H) 10/19/2021   Lab Results  Component Value Date   TSH 3.250 10/19/2021   Lab Results  Component Value Date   HGBA1C 5.2 10/19/2021   Lab Results  Component Value Date   WBC 6.0 10/19/2021   HGB 13.2 10/19/2021   HCT 40.5 10/19/2021   MCV 88 10/19/2021   PLT 279 10/19/2021   Lab Results  Component Value Date   ALT 24 10/19/2021   AST 20 10/19/2021   ALKPHOS 68 10/19/2021   BILITOT 0.2 10/19/2021   Lab Results   Component Value Date   VD25OH 33.2 10/19/2021     Review of Systems  Constitutional:  Negative for chills, fatigue and unexpected weight change.  HENT:  Negative for nosebleeds.   Eyes:  Negative for visual disturbance.  Respiratory:  Negative for cough, chest tightness, shortness of breath and wheezing.   Cardiovascular:  Positive for leg swelling. Negative for chest pain and palpitations.  Gastrointestinal:  Negative for abdominal pain, constipation and diarrhea.  Genitourinary:  Negative for difficulty urinating and menstrual problem (hot flashes about once a week interrupting sleep).  Musculoskeletal:  Positive for arthralgias and myalgias (muscle cramps at night). Negative for gait problem.  Neurological:  Negative for dizziness, weakness, light-headedness and headaches.  Psychiatric/Behavioral:  Negative for dysphoric mood and sleep disturbance. The patient is not nervous/anxious.     Patient Active Problem List   Diagnosis Date Noted   Mixed hyperlipidemia 10/19/2021   Vitamin D deficiency 10/19/2021   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/07/2020   Essential hypertension 07/07/2020   Sciatica, right side 07/07/2020   Carpal tunnel syndrome on right 07/07/2020   Mild intermittent asthma without complication 31/43/8887    Allergies  Allergen Reactions   Morphine And Related Anaphylaxis    Past Surgical History:  Procedure Laterality Date   TOTAL ABDOMINAL HYSTERECTOMY      Social History   Tobacco Use   Smoking status: Former    Packs/day: 0.50  Years: 30.00    Total pack years: 15.00    Types: Cigarettes    Quit date: 10/30/2018    Years since quitting: 3.6   Smokeless tobacco: Never  Vaping Use   Vaping Use: Never used  Substance Use Topics   Alcohol use: Never   Drug use: Never     Medication list has been reviewed and updated.  Current Meds  Medication Sig   albuterol (VENTOLIN HFA) 108 (90 Base) MCG/ACT inhaler TAKE 2 PUFFS BY MOUTH EVERY 6 HOURS  AS NEEDED FOR WHEEZE OR SHORTNESS OF BREATH   amLODipine (NORVASC) 5 MG tablet TAKE 1 TABLET (5 MG TOTAL) BY MOUTH DAILY.   estradiol (ESTRACE) 2 MG tablet TAKE 1 TABLET BY MOUTH EVERY DAY   ibuprofen (ADVIL) 800 MG tablet Take 1 tablet (800 mg total) by mouth every 8 (eight) hours as needed.   lisinopril (ZESTRIL) 20 MG tablet TAKE 1 TABLET BY MOUTH EVERY DAY   metaxalone (SKELAXIN) 800 MG tablet Take 1 tablet (800 mg total) by mouth 3 (three) times daily.       06/14/2022    3:36 PM 10/19/2021    1:37 PM 02/24/2021    9:22 AM 02/17/2021    3:31 PM  GAD 7 : Generalized Anxiety Score  Nervous, Anxious, on Edge 0 0 1 2  Control/stop worrying 0 0 1 2  Worry too much - different things 0 0 1 2  Trouble relaxing 0 0 0 0  Restless 0 0 0 0  Easily annoyed or irritable 0 0 0 0  Afraid - awful might happen 0 0 0 0  Total GAD 7 Score 0 0 3 6  Anxiety Difficulty Not difficult at all Not difficult at all  Not difficult at all       06/14/2022    3:36 PM 10/19/2021    1:36 PM 02/24/2021    9:23 AM  Depression screen PHQ 2/9  Decreased Interest 0 0 0  Down, Depressed, Hopeless 0 0 0  PHQ - 2 Score 0 0 0  Altered sleeping 0 0 2  Tired, decreased energy 0 0 0  Change in appetite 0 0 1  Feeling bad or failure about yourself  0 0 0  Trouble concentrating 0 0 0  Moving slowly or fidgety/restless 0 0 0  Suicidal thoughts 0 0 0  PHQ-9 Score 0 0 3  Difficult doing work/chores Not difficult at all Not difficult at all Not difficult at all    BP Readings from Last 3 Encounters:  06/14/22 112/72  10/19/21 122/80  03/31/21 137/83    Physical Exam Vitals and nursing note reviewed.  Constitutional:      General: She is not in acute distress.    Appearance: Normal appearance. She is well-developed.  HENT:     Head: Normocephalic and atraumatic.  Cardiovascular:     Rate and Rhythm: Normal rate and regular rhythm.     Pulses: Normal pulses.     Heart sounds: Normal heart sounds.      Comments: Trace edema both lower legs  Pulmonary:     Effort: Pulmonary effort is normal. No respiratory distress.     Breath sounds: No wheezing or rhonchi.  Musculoskeletal:     Cervical back: Normal range of motion.     Right hip: Tenderness present. Normal range of motion.     Left hip: Tenderness (over both lateral hips) present. Normal range of motion.     Right knee: Normal.  Left knee: Normal.     Right lower leg: No edema.     Left lower leg: No edema.  Lymphadenopathy:     Cervical: No cervical adenopathy.  Skin:    General: Skin is warm and dry.     Findings: No rash.  Neurological:     Mental Status: She is alert and oriented to person, place, and time.  Psychiatric:        Mood and Affect: Mood normal.        Behavior: Behavior normal.     Wt Readings from Last 3 Encounters:  06/14/22 172 lb (78 kg)  10/19/21 161 lb 3.2 oz (73.1 kg)  03/31/21 168 lb (76.2 kg)    BP 112/72   Pulse 67   Ht 5' 4" (1.626 m)   Wt 172 lb (78 kg)   SpO2 98%   BMI 29.52 kg/m   Assessment and Plan: 1. Essential hypertension Clinically stable exam with well controlled BP but having edema from the amlodipine Stop amlodipine and add HCTZ Pt to continue low sodium diet; benefits of regular exercise as able discussed. Try calcium and magnesium supplements for leg cramps Will check labs next visit Recheck in 6 weeks - hydrochlorothiazide (HYDRODIURIL) 12.5 MG tablet; Take 1 tablet (12.5 mg total) by mouth daily.  Dispense: 90 tablet; Refill: 1  2. Trochanteric bursitis of both hips Recommend heat as needed and Mobic once a day - meloxicam (MOBIC) 15 MG tablet; Take 1 tablet (15 mg total) by mouth daily.  Dispense: 30 tablet; Refill: 0  3. Mixed hyperlipidemia Continue low fat diet   4. Menopausal hot flushes On Estrace 2 mg but having some breakthrough Would not increase the dose at this time - continue monitor for worsening  5. Colon cancer screening She did not get  Colonoscopy done last year and is hesitant Will do Cologuard instead - Cologuard  6. Need for shingles vaccine  7. Mild intermittent asthma without complication - albuterol (VENTOLIN HFA) 108 (90 Base) MCG/ACT inhaler; TAKE 2 PUFFS BY MOUTH EVERY 6 HOURS AS NEEDED FOR WHEEZE OR SHORTNESS OF BREATH  Dispense: 8.5 each; Refill: 1  8. Need for immunization against influenza - Flu Vaccine QUAD 8moIM (Fluarix, Fluzone & Alfiuria Quad PF)   Partially dictated using DEditor, commissioning Any errors are unintentional.  LHalina Maidens MD [email or web address]  06/14/2022

## 2022-06-14 NOTE — Patient Instructions (Addendum)
Stop Amlodipine.  Continue Lisinopril.  Start HCTZ 12.5 mg daily.  Calcium 600 mg twice a day  Magnesium 400 mg take once a day  Complete the Cologuard test as soon as you can after it arrives.  Take it to [employer] for free shipping.

## 2022-06-27 ENCOUNTER — Ambulatory Visit: Payer: BC Managed Care – PPO | Admitting: Internal Medicine

## 2022-06-27 ENCOUNTER — Encounter: Payer: Self-pay | Admitting: Internal Medicine

## 2022-06-27 VITALS — BP 128/72 | HR 62 | Ht 64.0 in | Wt 168.0 lb

## 2022-06-27 DIAGNOSIS — M545 Low back pain, unspecified: Secondary | ICD-10-CM | POA: Diagnosis not present

## 2022-06-27 DIAGNOSIS — I10 Essential (primary) hypertension: Secondary | ICD-10-CM | POA: Diagnosis not present

## 2022-06-27 MED ORDER — IBUPROFEN 800 MG PO TABS: 800.0000 mg | ORAL_TABLET | Freq: Three times a day (TID) | ORAL | 0 refills | Status: DC | PRN

## 2022-06-27 MED ORDER — CYCLOBENZAPRINE HCL 10 MG PO TABS: 10.0000 mg | ORAL_TABLET | Freq: Three times a day (TID) | ORAL | 0 refills | Status: DC | PRN

## 2022-06-27 MED ORDER — PREDNISONE 10 MG PO TABS: 10.0000 mg | ORAL_TABLET | ORAL | 0 refills | Status: AC

## 2022-06-27 NOTE — Progress Notes (Signed)
Date:  06/27/2022   Name:  Susan Durham   DOB:  [birth year]   MRN:  [id number]  Visit performed using Video interpreter services.  Chief Complaint: Sciatica (Cannot sleep or walk correctly. Painful on right sided. )  Back Pain This is a recurrent problem. The problem is unchanged. The pain is present in the lumbar spine. The quality of the pain is described as cramping and aching. The pain is moderate. The symptoms are aggravated by standing. Associated symptoms include headaches (since starting HCTZ). Pertinent negatives include no bladder incontinence, chest pain, fever, numbness, paresthesias, perianal numbness or weakness. She has tried muscle relaxant, NSAIDs and ice for the symptoms. The treatment provided no relief.  Hypertension This is a chronic problem. The problem is controlled. Associated symptoms include headaches (since starting HCTZ). Pertinent negatives include no chest pain or shortness of breath. Past treatments include ACE inhibitors and diuretics. The current treatment provides significant improvement. Compliance problems include medication side effects.     Lab Results  Component Value Date   NA 140 10/19/2021   K 4.5 10/19/2021   CO2 23 10/19/2021   GLUCOSE 103 (H) 10/19/2021   BUN 12 10/19/2021   CREATININE 0.66 10/19/2021   CALCIUM 9.6 10/19/2021   EGFR 106 10/19/2021   GFRNONAA 75 09/17/2019   Lab Results  Component Value Date   CHOL 182 10/19/2021   HDL 37 (L) 10/19/2021   LDLCALC 88 10/19/2021   TRIG 346 (H) 10/19/2021   CHOLHDL 4.9 (H) 10/19/2021   Lab Results  Component Value Date   TSH 3.250 10/19/2021   Lab Results  Component Value Date   HGBA1C 5.2 10/19/2021   Lab Results  Component Value Date   WBC 6.0 10/19/2021   HGB 13.2 10/19/2021   HCT 40.5 10/19/2021   MCV 88 10/19/2021   PLT 279 10/19/2021   Lab Results  Component Value Date   ALT 24 10/19/2021   AST 20 10/19/2021   ALKPHOS 68 10/19/2021   BILITOT 0.2 10/19/2021    Lab Results  Component Value Date   VD25OH 33.2 10/19/2021     Review of Systems  Constitutional:  Negative for chills, fatigue and fever.  Respiratory:  Negative for chest tightness and shortness of breath.   Cardiovascular:  Negative for chest pain and leg swelling.  Gastrointestinal:  Negative for constipation and nausea.  Genitourinary:  Negative for bladder incontinence.  Musculoskeletal:  Positive for back pain.  Neurological:  Positive for headaches (since starting HCTZ). Negative for weakness, numbness and paresthesias.  Psychiatric/Behavioral:  Negative for dysphoric mood and sleep disturbance. The patient is not nervous/anxious.     Patient Active Problem List   Diagnosis Date Noted   Mixed hyperlipidemia 10/19/2021   Vitamin D deficiency 10/19/2021   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/07/2020   Essential hypertension 07/07/2020   Sciatica, right side 07/07/2020   Carpal tunnel syndrome on right 07/07/2020   Mild intermittent asthma without complication 85/63/1497    Allergies  Allergen Reactions   Morphine And Related Anaphylaxis    Past Surgical History:  Procedure Laterality Date   TOTAL ABDOMINAL HYSTERECTOMY      Social History   Tobacco Use   Smoking status: Former    Packs/day: 0.50    Years: 30.00    Total pack years: 15.00    Types: Cigarettes    Quit date: 10/30/2018    Years since quitting: 3.6   Smokeless tobacco: Never  Vaping Use   Vaping Use:  Never used  Substance Use Topics   Alcohol use: Never   Drug use: Never     Medication list has been reviewed and updated.  Current Meds  Medication Sig   albuterol (VENTOLIN HFA) 108 (90 Base) MCG/ACT inhaler TAKE 2 PUFFS BY MOUTH EVERY 6 HOURS AS NEEDED FOR WHEEZE OR SHORTNESS OF BREATH   estradiol (ESTRACE) 2 MG tablet TAKE 1 TABLET BY MOUTH EVERY DAY   hydrochlorothiazide (HYDRODIURIL) 12.5 MG tablet Take 1 tablet (12.5 mg total) by mouth daily.   ibuprofen (ADVIL) 800 MG tablet Take 1  tablet (800 mg total) by mouth every 8 (eight) hours as needed.   lisinopril (ZESTRIL) 20 MG tablet TAKE 1 TABLET BY MOUTH EVERY DAY   meloxicam (MOBIC) 15 MG tablet Take 1 tablet (15 mg total) by mouth daily.   metaxalone (SKELAXIN) 800 MG tablet Take 1 tablet (800 mg total) by mouth 3 (three) times daily.       06/14/2022    3:36 PM 10/19/2021    1:37 PM 02/24/2021    9:22 AM 02/17/2021    3:31 PM  GAD 7 : Generalized Anxiety Score  Nervous, Anxious, on Edge 0 0 1 2  Control/stop worrying 0 0 1 2  Worry too much - different things 0 0 1 2  Trouble relaxing 0 0 0 0  Restless 0 0 0 0  Easily annoyed or irritable 0 0 0 0  Afraid - awful might happen 0 0 0 0  Total GAD 7 Score 0 0 3 6  Anxiety Difficulty Not difficult at all Not difficult at all  Not difficult at all       06/14/2022    3:36 PM 10/19/2021    1:36 PM 02/24/2021    9:23 AM  Depression screen PHQ 2/9  Decreased Interest 0 0 0  Down, Depressed, Hopeless 0 0 0  PHQ - 2 Score 0 0 0  Altered sleeping 0 0 2  Tired, decreased energy 0 0 0  Change in appetite 0 0 1  Feeling bad or failure about yourself  0 0 0  Trouble concentrating 0 0 0  Moving slowly or fidgety/restless 0 0 0  Suicidal thoughts 0 0 0  PHQ-9 Score 0 0 3  Difficult doing work/chores Not difficult at all Not difficult at all Not difficult at all    BP Readings from Last 3 Encounters:  06/27/22 128/72  06/14/22 112/72  10/19/21 122/80    Physical Exam Vitals and nursing note reviewed.  Constitutional:      General: She is not in acute distress.    Appearance: She is well-developed.  HENT:     Head: Normocephalic and atraumatic.  Cardiovascular:     Rate and Rhythm: Normal rate and regular rhythm.  Pulmonary:     Effort: Pulmonary effort is normal. No respiratory distress.     Breath sounds: No wheezing or rhonchi.  Musculoskeletal:     Lumbar back: Tenderness (mid line lower lumbar spine) present. No swelling, deformity or spasms.  Negative right straight leg raise test and negative left straight leg raise test.  Skin:    General: Skin is warm and dry.     Findings: No rash.  Neurological:     Mental Status: She is alert and oriented to person, place, and time.  Psychiatric:        Mood and Affect: Mood normal.        Behavior: Behavior normal.     Wt  Readings from Last 3 Encounters:  06/27/22 168 lb (76.2 kg)  06/14/22 172 lb (78 kg)  10/19/21 161 lb 3.2 oz (73.1 kg)    BP 128/72 (BP Location: Right Arm, Patient Position: Sitting, Cuff Size: Normal)   Pulse 62   Ht _0  (1.626 m)   Wt 168 lb (76.2 kg)   SpO2 96%   BMI 28.84 kg/m   Assessment and Plan: 1. Acute midline low back pain without sciatica Stop Metaxalone Start Flexeril and steroid taper She is intolerant to narcotics and Tramadol. Use heat rather than ice Work excuse for 3 days. - cyclobenzaprine (FLEXERIL) 10 MG tablet; Take 1 tablet (10 mg total) by mouth 3 (three) times daily as needed for muscle spasms.  Dispense: 30 tablet; Refill: 0 - ibuprofen (ADVIL) 800 MG tablet; Take 1 tablet (800 mg total) by mouth every 8 (eight) hours as needed.  Dispense: 30 tablet; Refill: 0 - predniSONE (DELTASONE) 10 MG tablet; Take 1 tablet (10 mg total) by mouth as directed for 6 days. Take 6,5,4,3,2,1 then stop  Dispense: 21 tablet; Refill: 0  2. Essential hypertension BP controlled but having headaches since starting HCTZ Recommend discontinue HCTZ Continue Lisinopril Recheck BP in 3 months and add medications if not controlled at that time.   Partially dictated using Editor, commissioning. Any errors are unintentional.  Halina Maidens, MD [facility]  06/27/2022

## 2022-06-29 DIAGNOSIS — Z1211 Encounter for screening for malignant neoplasm of colon: Secondary | ICD-10-CM | POA: Diagnosis not present

## 2022-07-10 LAB — COLOGUARD: COLOGUARD: NEGATIVE

## 2022-07-11 ENCOUNTER — Other Ambulatory Visit: Payer: Self-pay | Admitting: Internal Medicine

## 2022-07-11 DIAGNOSIS — M7061 Trochanteric bursitis, right hip: Secondary | ICD-10-CM

## 2022-07-12 ENCOUNTER — Ambulatory Visit
Admission: EM | Admit: 2022-07-12 | Discharge: 2022-07-12 | Disposition: A | Discharge status: Home / Self Care | Payer: BC Managed Care – PPO

## 2022-07-12 ENCOUNTER — Telehealth: Payer: Self-pay | Admitting: Internal Medicine

## 2022-07-12 DIAGNOSIS — I10 Essential (primary) hypertension: Secondary | ICD-10-CM | POA: Diagnosis not present

## 2022-07-12 NOTE — Discharge Instructions (Addendum)
Follow up with your primary care provider.

## 2022-07-12 NOTE — ED Provider Notes (Signed)
[facility]    CSN: [id number] Arrival date & time: 07/12/22  1518      History   Chief Complaint Chief Complaint  Patient presents with   Hypertension    HPI Susan Durham is a 51 y.o. female.    Hypertension    Patient presents to urgent care following multiple measurements of elevated blood pressure by clinical staff.  She reports feeling dizziness, blurry eyes, frontal and occipital headache yesterday and reported to nurse at workplace.  She states that when she has the symptoms in the past her blood pressure is always elevated.  She reports blood pressure was elevated to 175 systolic.  Patient reported to her primary care provider's office today and they referred her to urgent care.  BP is elevated today at 150/86.  Past Medical History:  Diagnosis Date   Hypertension     Patient Active Problem List   Diagnosis Date Noted   Mixed hyperlipidemia 10/19/2021   Vitamin D deficiency 10/19/2021   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/07/2020   Essential hypertension 07/07/2020   Sciatica, right side 07/07/2020   Carpal tunnel syndrome on right 07/07/2020   Mild intermittent asthma without complication 07/07/2020    Past Surgical History:  Procedure Laterality Date   TOTAL ABDOMINAL HYSTERECTOMY      OB History   No obstetric history on file.      Home Medications    Prior to Admission medications   Medication Sig Start Date End Date Taking? Authorizing Provider  albuterol (VENTOLIN HFA) 108 (90 Base) MCG/ACT inhaler TAKE 2 PUFFS BY MOUTH EVERY 6 HOURS AS NEEDED FOR WHEEZE OR SHORTNESS OF BREATH 06/14/22  Yes Reubin Milan, MD  cyclobenzaprine (FLEXERIL) 10 MG tablet Take 1 tablet (10 mg total) by mouth 3 (three) times daily as needed for muscle spasms. 06/27/22  Yes Reubin Milan, MD  estradiol (ESTRACE) 2 MG tablet TAKE 1 TABLET BY MOUTH EVERY DAY 05/30/22  Yes Reubin Milan, MD  ibuprofen (ADVIL) 800 MG tablet Take 1 tablet (800 mg  total) by mouth every 8 (eight) hours as needed. 06/27/22  Yes Reubin Milan, MD  lisinopril (ZESTRIL) 20 MG tablet TAKE 1 TABLET BY MOUTH EVERY DAY 05/30/22  Yes Reubin Milan, MD  meloxicam (MOBIC) 15 MG tablet TAKE 1 TABLET (15 MG TOTAL) BY MOUTH DAILY. 07/12/22  Yes Reubin Milan, MD  metaxalone (SKELAXIN) 800 MG tablet Take 1 tablet (800 mg total) by mouth 3 (three) times daily. 10/19/21  Yes Reubin Milan, MD    Family History Family History  Problem Relation Age of Onset   Sleep apnea Mother    Hypertension Mother    Diabetes Mother    Hypertension Father    Throat cancer Father    Diabetes Father    Hypertension Maternal Aunt    Diabetes Maternal Aunt    Hypertension Maternal Uncle    Hypertension Paternal Aunt    Hypertension Paternal Uncle     Social History Social History   Tobacco Use   Smoking status: Former    Packs/day: 0.50    Years: 30.00    Total pack years: 15.00    Types: Cigarettes    Quit date: 10/30/2018    Years since quitting: 3.7   Smokeless tobacco: Never  Vaping Use   Vaping Use: Never used  Substance Use Topics   Alcohol use: Never   Drug use: Never     Allergies   Morphine and related,  Hydrochlorothiazide, and Tramadol   Review of Systems Review of Systems   Physical Exam Triage Vital Signs ED Triage Vitals  Enc Vitals Group     BP 07/12/22 1537 (!) 150/86     Pulse Rate 07/12/22 1537 81     Resp 07/12/22 1537 18     Temp 07/12/22 1537 98.1 F (36.7 C)     Temp Source 07/12/22 1537 Oral     SpO2 07/12/22 1537 94 %     Weight 07/12/22 1536 160 lb (72.6 kg)     Height 07/12/22 1536 5\' 4"  (1.626 m)     Head Circumference --      Peak Flow --      Pain Score 07/12/22 1536 9     Pain Loc --      Pain Edu? --      Excl. in GC? --    No data found.  Updated Vital Signs BP (!) 150/86 (BP Location: Left Arm)   Pulse 81   Temp 98.1 F (36.7 C) (Oral)   Resp 18   Ht 5\' 4"  (1.626 m)   Wt 160 lb (72.6 kg)    SpO2 94%   BMI 27.46 kg/m   Visual Acuity Right Eye Distance:   Left Eye Distance:   Bilateral Distance:    Right Eye Near:   Left Eye Near:    Bilateral Near:     Physical Exam Vitals reviewed.  Constitutional:      Appearance: Normal appearance.  Cardiovascular:     Rate and Rhythm: Normal rate and regular rhythm.     Pulses: Normal pulses.     Heart sounds: Normal heart sounds.  Pulmonary:     Breath sounds: Normal breath sounds.  Skin:    General: Skin is warm and dry.  Neurological:     General: No focal deficit present.     Mental Status: She is alert and oriented to person, place, and time.  Psychiatric:        Mood and Affect: Mood normal.      UC Treatments / Results  Labs (all labs ordered are listed, but only abnormal results are displayed) Labs Reviewed - No data to display  EKG   Radiology No results found.  Procedures Procedures (including critical care time)  Medications Ordered in UC Medications - No data to display  Initial Impression / Assessment and Plan / UC Course  I have reviewed the triage vital signs and the nursing notes.  Pertinent labs & imaging results that were available during my care of the patient were reviewed by me and considered in my medical decision making (see chart for details).   Reassuring physical exam.  Blood pressure remains elevated however not nearly as high as reported yesterday.  Long discussion with patient with assistance of interpreter.  Unclear whether her symptoms of elevated blood pressure are related to stress/illness versus symptoms secondary to elevated blood pressure.  I informed the patient that, while her blood pressure today is elevated above goal, it did not constitute a hypertensive emergency and I was unclear whether her pressure was a result of some other cause such as illness.  Recommended patient measure her blood pressure systematically at home.  Instructed her to measure her pressure in a  relaxed environment sometimes in the morning before work, afternoon, evening, and present these measurements to her primary care provider.  Patient continues to be concerned regarding her symptoms as related to her elevated blood pressure so  I suggested that she increase lisinopril from current 20 mg to 40 mg and measure her blood pressure as a result.  If she continues to have symptoms of dizziness, I informed her that this could be related to low blood pressure as well as high.  Recommended patient measure blood pressure at home and keep log to present to primary care provider.  Given patient's concern regarding her symptoms of headache which she attributes to elevated blood pressure, combined with 2 measurements of elevated blood pressure, suggested that she could safely increase her dose to lisinopril 40 mg.   Final Clinical Impressions(s) / UC Diagnoses   Final diagnoses:  Primary hypertension     Discharge Instructions      Follow up with your primary care provider.      ED Prescriptions   None    PDMP not reviewed this encounter.   Charma Igo, [place] 07/12/22 1636

## 2022-07-12 NOTE — ED Triage Notes (Signed)
Pt c/o hypertension, headache, dizziness, pressure behind her eyes x2days  Pt was seen by her nurse at work and her BP was 175/80. Pt states that she has been taking her Lisinopril regularly and has missed no doses.

## 2022-07-12 NOTE — Telephone Encounter (Signed)
Requested Prescriptions  Pending Prescriptions Disp Refills   meloxicam (MOBIC) 15 MG tablet [Pharmacy Med Name: MELOXICAM 15 MG TABLET] 30 tablet 0    Sig: TAKE 1 TABLET (15 MG TOTAL) BY MOUTH DAILY.     Analgesics:  COX2 Inhibitors Failed - 07/11/2022  1:49 PM      Failed - Manual Review: Labs are only required if the patient has taken medication for more than 8 weeks.      Passed - HGB in normal range and within 360 days    Hemoglobin  Date Value Ref Range Status  10/19/2021 13.2 11.1 - 15.9 g/dL Final         Passed - Cr in normal range and within 360 days    Creatinine, Ser  Date Value Ref Range Status  10/19/2021 0.66 0.57 - 1.00 mg/dL Final         Passed - HCT in normal range and within 360 days    Hematocrit  Date Value Ref Range Status  10/19/2021 40.5 34.0 - 46.6 % Final         Passed - AST in normal range and within 360 days    AST  Date Value Ref Range Status  10/19/2021 20 0 - 40 IU/L Final         Passed - ALT in normal range and within 360 days    ALT  Date Value Ref Range Status  10/19/2021 24 0 - 32 IU/L Final         Passed - eGFR is 30 or above and within 360 days    GFR calc Af Amer  Date Value Ref Range Status  09/17/2019 87 >59 mL/min/1.73 Final   GFR calc non Af Amer  Date Value Ref Range Status  09/17/2019 75 >59 mL/min/1.73 Final   eGFR  Date Value Ref Range Status  10/19/2021 106 >59 mL/min/1.73 Final         Passed - Patient is not pregnant      Passed - Valid encounter within last 12 months    Recent Outpatient Visits           2 weeks ago Acute midline low back pain without sciatica   [place] [facility] at [facility], Jesse Sans, MD   4 weeks ago Essential hypertension   [place] [facility] at [facility], Jesse Sans, MD   8 months ago Essential hypertension   [place] [facility] at [facility], Jesse Sans, MD   1  year ago Essential hypertension   [place] [facility] at [facility], Jesse Sans, MD   1 year ago Essential hypertension   [place] [facility] at [facility], Jesse Sans, MD

## 2022-07-12 NOTE — Telephone Encounter (Signed)
Patient walk in and said her blood pressure was high, there were no appointments available today for either providers. She was sent down to urgent care.

## 2022-08-30 ENCOUNTER — Other Ambulatory Visit: Payer: Self-pay | Admitting: Internal Medicine

## 2022-08-30 DIAGNOSIS — N951 Menopausal and female climacteric states: Secondary | ICD-10-CM

## 2022-08-31 NOTE — Telephone Encounter (Signed)
Requested Prescriptions  Pending Prescriptions Disp Refills   estradiol (ESTRACE) 2 MG tablet [Pharmacy Med Name: ESTRADIOL 2 MG TABLET] 90 tablet 0    Sig: TAKE 1 TABLET BY MOUTH EVERY DAY     OB/GYN:  Estrogens Failed - 08/30/2022  9:17 AM      Failed - Last BP in normal range    BP Readings from Last 1 Encounters:  07/12/22 (!) 150/86         Passed - Mammogram is up-to-date per Health Maintenance      Passed - Valid encounter within last 12 months    Recent Outpatient Visits           2 months ago Acute midline low back pain without sciatica   [place] [facility], Jesse Sans, MD   2 months ago Essential hypertension   [place] [facility] at [facility], Jesse Sans, MD   10 months ago Essential hypertension   [place] [facility] at [facility], Jesse Sans, MD   1 year ago Essential hypertension   [place] [facility] at [facility], Jesse Sans, MD   1 year ago Essential hypertension   [place] [facility] at [facility], Jesse Sans, MD

## 2022-11-24 ENCOUNTER — Other Ambulatory Visit: Payer: Self-pay | Admitting: Internal Medicine

## 2022-11-24 DIAGNOSIS — I10 Essential (primary) hypertension: Secondary | ICD-10-CM

## 2022-11-24 NOTE — Telephone Encounter (Signed)
Requested Prescriptions  Pending Prescriptions Disp Refills   lisinopril (ZESTRIL) 20 MG tablet [Pharmacy Med Name: LISINOPRIL 20 MG TABLET] 90 tablet 0    Sig: TAKE 1 TABLET BY MOUTH EVERY DAY     Cardiovascular:  ACE Inhibitors Failed - 11/24/2022  2:16 AM      Failed - Cr in normal range and within 180 days    Creatinine, Ser  Date Value Ref Range Status  10/19/2021 0.66 0.57 - 1.00 mg/dL Final         Failed - K in normal range and within 180 days    Potassium  Date Value Ref Range Status  10/19/2021 4.5 3.5 - 5.2 mmol/L Final         Failed - Last BP in normal range    BP Readings from Last 1 Encounters:  07/12/22 (!) 150/86         Passed - Patient is not pregnant      Passed - Valid encounter within last 6 months    Recent Outpatient Visits           5 months ago Acute midline low back pain without sciatica   [place] at [facility], Jesse Sans, MD   5 months ago Essential hypertension   [place] at [facility], Jesse Sans, MD   1 year ago Essential hypertension   [place] at [facility], Jesse Sans, MD   1 year ago Essential hypertension   [place] at [facility], Jesse Sans, MD   1 year ago Essential hypertension   [place] at [facility], Jesse Sans, MD

## 2022-11-26 ENCOUNTER — Other Ambulatory Visit: Payer: Self-pay | Admitting: Internal Medicine

## 2022-11-26 DIAGNOSIS — N951 Menopausal and female climacteric states: Secondary | ICD-10-CM

## 2022-11-28 NOTE — Telephone Encounter (Signed)
Requested Prescriptions  Pending Prescriptions Disp Refills   estradiol (ESTRACE) 2 MG tablet [Pharmacy Med Name: ESTRADIOL 2 MG TABLET] 90 tablet 2    Sig: TAKE 1 TABLET BY MOUTH EVERY DAY     OB/GYN:  Estrogens Failed - 11/26/2022  8:54 AM      Failed - Last BP in normal range    BP Readings from Last 1 Encounters:  07/12/22 (!) 150/86         Passed - Mammogram is up-to-date per Health Maintenance      Passed - Valid encounter within last 12 months    Recent Outpatient Visits           5 months ago Acute midline low back pain without sciatica   [place] [facility] [place] [facility], Jesse Sans, MD   5 months ago Essential hypertension   [place] [facility] at [facility], Jesse Sans, MD   1 year ago Essential hypertension   [place] [facility] at [facility], Jesse Sans, MD   1 year ago Essential hypertension   [place] at [facility], Jesse Sans, MD   1 year ago Essential hypertension   [place] at [facility], Jesse Sans, MD

## 2022-11-30 ENCOUNTER — Encounter: Payer: Self-pay | Admitting: Internal Medicine

## 2022-11-30 ENCOUNTER — Ambulatory Visit: Payer: BC Managed Care – PPO | Admitting: Internal Medicine

## 2022-11-30 ENCOUNTER — Telehealth: Payer: Self-pay | Admitting: Internal Medicine

## 2022-11-30 VITALS — BP 120/74 | HR 85 | Temp 98.3°F | Ht 64.0 in | Wt 172.4 lb

## 2022-11-30 DIAGNOSIS — J4521 Mild intermittent asthma with (acute) exacerbation: Secondary | ICD-10-CM | POA: Diagnosis not present

## 2022-11-30 DIAGNOSIS — J4520 Mild intermittent asthma, uncomplicated: Secondary | ICD-10-CM | POA: Diagnosis not present

## 2022-11-30 MED ORDER — PROMETHAZINE-DM 6.25-15 MG/5ML PO SYRP
5.0000 mL | ORAL_SOLUTION | Freq: Four times a day (QID) | ORAL | 0 refills | Status: DC | PRN
Start: 2022-11-30 — End: 2022-12-08

## 2022-11-30 MED ORDER — ALBUTEROL SULFATE HFA 108 (90 BASE) MCG/ACT IN AERS: INHALATION_SPRAY | RESPIRATORY_TRACT | 5 refills | Status: DC

## 2022-11-30 MED ORDER — AZITHROMYCIN 250 MG PO TABS: ORAL_TABLET | ORAL | 0 refills | Status: AC

## 2022-11-30 MED ORDER — ALBUTEROL SULFATE (2.5 MG/3ML) 0.083% IN NEBU: 2.5000 mg | INHALATION_SOLUTION | Freq: Four times a day (QID) | RESPIRATORY_TRACT | 1 refills | Status: DC | PRN

## 2022-11-30 MED ORDER — PREDNISONE 10 MG PO TABS: ORAL_TABLET | ORAL | 0 refills | Status: AC

## 2022-11-30 NOTE — Telephone Encounter (Signed)
A user error has taken place: encounter opened in error, closed for administrative reasons.

## 2022-11-30 NOTE — Progress Notes (Signed)
Date:  11/30/2022   Name:  Susan Durham   DOB:  [birth year]   MRN:  [id number]   Chief Complaint: Cough (5 days- clear production. Said her asthma is flaring up because of the pollen. Sinus pain and pressure with headache. SOB. NO Covid- tested herself at home.)  Cough This is a new problem. Episode onset: 5 days. The problem has been waxing and waning. The problem occurs every few minutes. The cough is Productive of sputum. Associated symptoms include headaches, nasal congestion, postnasal drip, rhinorrhea and wheezing. Pertinent negatives include no chills, fever, sore throat or shortness of breath. She has tried a beta-agonist inhaler and OTC cough suppressant for the symptoms. The treatment provided mild relief. Her past medical history is significant for asthma.    Lab Results  Component Value Date   NA 140 10/19/2021   K 4.5 10/19/2021   CO2 23 10/19/2021   GLUCOSE 103 (H) 10/19/2021   BUN 12 10/19/2021   CREATININE 0.66 10/19/2021   CALCIUM 9.6 10/19/2021   EGFR 106 10/19/2021   GFRNONAA 75 09/17/2019   Lab Results  Component Value Date   CHOL 182 10/19/2021   HDL 37 (L) 10/19/2021   LDLCALC 88 10/19/2021   TRIG 346 (H) 10/19/2021   CHOLHDL 4.9 (H) 10/19/2021   Lab Results  Component Value Date   TSH 3.250 10/19/2021   Lab Results  Component Value Date   HGBA1C 5.2 10/19/2021   Lab Results  Component Value Date   WBC 6.0 10/19/2021   HGB 13.2 10/19/2021   HCT 40.5 10/19/2021   MCV 88 10/19/2021   PLT 279 10/19/2021   Lab Results  Component Value Date   ALT 24 10/19/2021   AST 20 10/19/2021   ALKPHOS 68 10/19/2021   BILITOT 0.2 10/19/2021   Lab Results  Component Value Date   VD25OH 33.2 10/19/2021     Review of Systems  Constitutional:  Negative for chills, fatigue and fever.  HENT:  Positive for postnasal drip and rhinorrhea. Negative for sore throat and trouble swallowing.   Respiratory:  Positive for cough, chest tightness and wheezing.  Negative for shortness of breath.   Neurological:  Positive for headaches.  Psychiatric/Behavioral:  Negative for dysphoric mood and sleep disturbance. The patient is not nervous/anxious.     Patient Active Problem List   Diagnosis Date Noted   Mixed hyperlipidemia 10/19/2021   Vitamin D deficiency 10/19/2021   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/07/2020   Essential hypertension 07/07/2020   Sciatica, right side 07/07/2020   Carpal tunnel syndrome on right 07/07/2020   Mild intermittent asthma without complication [id number]    Allergies  Allergen Reactions   Morphine And Related Anaphylaxis   Hydrochlorothiazide Other (See Comments)    headache   Tramadol Other (See Comments)    anxiety    Past Surgical History:  Procedure Laterality Date   TOTAL ABDOMINAL HYSTERECTOMY      Social History   Tobacco Use   Smoking status: Former    Packs/day: 0.50    Years: 30.00    Additional pack years: 0.00    Total pack years: 15.00    Types: Cigarettes    Quit date: 10/30/2018    Years since quitting: 4.0   Smokeless tobacco: Never  Vaping Use   Vaping Use: Never used  Substance Use Topics   Alcohol use: Never   Drug use: Never     Medication list has been reviewed and updated.  Current Meds  Medication Sig   albuterol (PROVENTIL) (2.5 MG/3ML) 0.083% nebulizer solution Take 3 mLs (2.5 mg total) by nebulization every 6 (six) hours as needed for wheezing or shortness of breath.   azithromycin (ZITHROMAX Z-PAK) 250 MG tablet UAD   cyclobenzaprine (FLEXERIL) 10 MG tablet Take 1 tablet (10 mg total) by mouth 3 (three) times daily as needed for muscle spasms.   estradiol (ESTRACE) 2 MG tablet TAKE 1 TABLET BY MOUTH EVERY DAY   ibuprofen (ADVIL) 800 MG tablet Take 1 tablet (800 mg total) by mouth every 8 (eight) hours as needed.   lisinopril (ZESTRIL) 20 MG tablet TAKE 1 TABLET BY MOUTH EVERY DAY   meloxicam (MOBIC) 15 MG tablet TAKE 1 TABLET (15 MG TOTAL) BY MOUTH DAILY.    metaxalone (SKELAXIN) 800 MG tablet Take 1 tablet (800 mg total) by mouth 3 (three) times daily.   predniSONE (DELTASONE) 10 MG tablet Take 6 tablets (60 mg total) by mouth daily with breakfast for 1 day, THEN 5 tablets (50 mg total) daily with breakfast for 1 day, THEN 4 tablets (40 mg total) daily with breakfast for 1 day, THEN 3 tablets (30 mg total) daily with breakfast for 1 day, THEN 2 tablets (20 mg total) daily with breakfast for 1 day, THEN 1 tablet (10 mg total) daily with breakfast for 1 day.   promethazine-dextromethorphan (PROMETHAZINE-DM) 6.25-15 MG/5ML syrup Take 5 mLs by mouth 4 (four) times daily as needed for up to 9 days for cough.   [DISCONTINUED] albuterol (VENTOLIN HFA) 108 (90 Base) MCG/ACT inhaler TAKE 2 PUFFS BY MOUTH EVERY 6 HOURS AS NEEDED FOR WHEEZE OR SHORTNESS OF BREATH       11/30/2022    4:21 PM 06/14/2022    3:36 PM 10/19/2021    1:37 PM 02/24/2021    9:22 AM  GAD 7 : Generalized Anxiety Score  Nervous, Anxious, on Edge 0 0 0 1  Control/stop worrying 0 0 0 1  Worry too much - different things 0 0 0 1  Trouble relaxing 0 0 0 0  Restless 0 0 0 0  Easily annoyed or irritable 0 0 0 0  Afraid - awful might happen 0 0 0 0  Total GAD 7 Score 0 0 0 3  Anxiety Difficulty Not difficult at all Not difficult at all Not difficult at all        11/30/2022    4:21 PM 06/14/2022    3:36 PM 10/19/2021    1:36 PM  Depression screen PHQ 2/9  Decreased Interest 0 0 0  Down, Depressed, Hopeless 0 0 0  PHQ - 2 Score 0 0 0  Altered sleeping 0 0 0  Tired, decreased energy 0 0 0  Change in appetite 0 0 0  Feeling bad or failure about yourself  0 0 0  Trouble concentrating 0 0 0  Moving slowly or fidgety/restless 0 0 0  Suicidal thoughts 0 0 0  PHQ-9 Score 0 0 0  Difficult doing work/chores Not difficult at all Not difficult at all Not difficult at all    BP Readings from Last 3 Encounters:  11/30/22 120/74  07/12/22 (!) 150/86  06/27/22 128/72    Physical  Exam Constitutional:      Appearance: Normal appearance.  HENT:     Nose:     Right Sinus: No maxillary sinus tenderness or frontal sinus tenderness.     Left Sinus: No maxillary sinus tenderness or frontal sinus tenderness.  Cardiovascular:  Rate and Rhythm: Normal rate and regular rhythm.  Pulmonary:     Effort: Pulmonary effort is normal.     Breath sounds: Decreased breath sounds present. No wheezing or rhonchi.  Musculoskeletal:     Cervical back: Normal range of motion.  Lymphadenopathy:     Cervical: No cervical adenopathy.  Neurological:     Mental Status: She is alert.     Wt Readings from Last 3 Encounters:  11/30/22 172 lb 6.4 oz (78.2 kg)  07/12/22 160 lb (72.6 kg)  06/27/22 168 lb (76.2 kg)    BP 120/74   Pulse 85   Temp 98.3 F (36.8 C) (Oral)   Ht 5\' 4"  (1.626 m)   Wt 172 lb 6.4 oz (78.2 kg)   SpO2 94%   BMI 29.59 kg/m   Assessment and Plan:  Problem List Items Addressed This Visit       Respiratory   Mild intermittent asthma without complication (Chronic)   Relevant Medications   predniSONE (DELTASONE) 10 MG tablet   albuterol (PROVENTIL) (2.5 MG/3ML) 0.083% nebulizer solution   albuterol (VENTOLIN HFA) 108 (90 Base) MCG/ACT inhaler   Other Visit Diagnoses     Mild intermittent asthma with exacerbation    -  Primary   Rx for new nebulizer machine given follow up if no improvement   Relevant Medications   predniSONE (DELTASONE) 10 MG tablet   azithromycin (ZITHROMAX Z-PAK) 250 MG tablet   albuterol (PROVENTIL) (2.5 MG/3ML) 0.083% nebulizer solution   albuterol (VENTOLIN HFA) 108 (90 Base) MCG/ACT inhaler   promethazine-dextromethorphan (PROMETHAZINE-DM) 6.25-15 MG/5ML syrup       No follow-ups on file.   Partially dictated using [place], any errors are not intentional.  Glean Hess, MD [place], [place]

## 2022-12-08 ENCOUNTER — Ambulatory Visit
Admission: RE | Admit: 2022-12-08 | Discharge: 2022-12-08 | Disposition: A | Discharge status: Home / Self Care | Payer: BC Managed Care – PPO | Attending: Internal Medicine | Admitting: Internal Medicine

## 2022-12-08 ENCOUNTER — Encounter: Payer: Self-pay | Admitting: Internal Medicine

## 2022-12-08 ENCOUNTER — Ambulatory Visit: Payer: Self-pay | Admitting: *Deleted

## 2022-12-08 ENCOUNTER — Ambulatory Visit
Admission: RE | Admit: 2022-12-08 | Discharge: 2022-12-08 | Disposition: A | Discharge status: Home / Self Care | Payer: BC Managed Care – PPO | Source: Ambulatory Visit | Attending: Internal Medicine | Admitting: Internal Medicine

## 2022-12-08 ENCOUNTER — Ambulatory Visit (INDEPENDENT_AMBULATORY_CARE_PROVIDER_SITE_OTHER): Payer: BC Managed Care – PPO | Admitting: Internal Medicine

## 2022-12-08 VITALS — BP 128/70 | HR 75 | Ht 64.0 in | Wt 172.0 lb

## 2022-12-08 DIAGNOSIS — J4520 Mild intermittent asthma, uncomplicated: Secondary | ICD-10-CM | POA: Diagnosis not present

## 2022-12-08 DIAGNOSIS — J45909 Unspecified asthma, uncomplicated: Secondary | ICD-10-CM | POA: Diagnosis not present

## 2022-12-08 DIAGNOSIS — R0789 Other chest pain: Secondary | ICD-10-CM | POA: Diagnosis not present

## 2022-12-08 MED ORDER — PREDNISONE 10 MG PO TABS
ORAL_TABLET | ORAL | 0 refills | Status: AC
Start: 2022-12-08 — End: 2022-12-20

## 2022-12-08 MED ORDER — FLUTICASONE-SALMETEROL 100-50 MCG/ACT IN AEPB
1.0000 | INHALATION_SPRAY | Freq: Two times a day (BID) | RESPIRATORY_TRACT | 5 refills | Status: DC
Start: 2022-12-08 — End: 2023-12-08

## 2022-12-08 NOTE — Telephone Encounter (Signed)
  Chief Complaint: SOB Symptoms: SOB with coughing, chest tightness, increased fatigue, sweating. Seen 11/30/22, completed ATBs Tuesday, "No better, told to call for appointment if not feeling better." Frequency: 11/30/22 Pertinent Negatives: Patient denies SOB at rest Disposition: [] ED /[] Urgent Care (no appt availability in office) / [x] Appointment(In office/virtual)/ []  [facility]/ [] Home Care/ [] Refused Recommended Disposition /[]  Mobile Bus/ []  Follow-up with PCP Additional Notes: Pt advised at OV 11/30/22 to make F/U appt if not feeling better once completing antibiotics, completed Tuesday. Appt secured for today. Advised ED for worsening symptoms. Pt verbalizes understanding. Reason for Disposition  [1] Longstanding difficulty breathing AND [2] not responding to usual therapy  Answer Assessment - Initial Assessment Questions 1. RESPIRATORY STATUS: "Describe your breathing?" (e.g., wheezing, shortness of breath, unable to speak, severe coughing)      "When fatigued"  with coughing 2. ONSET: "When did this breathing problem begin?"      Seen 11/30/22 3. PATTERN "Does the difficult breathing come and go, or has it been constant since it started?"       4. SEVERITY: "How bad is your breathing?" (e.g., mild, moderate, severe)    - MILD: No SOB at rest, mild SOB with walking, speaks normally in sentences, can lie down, no retractions, pulse < 100.    - MODERATE: SOB at rest, SOB with minimal exertion and prefers to sit, cannot lie down flat, speaks in phrases, mild retractions, audible wheezing, pulse 100-120.    - SEVERE: Very SOB at rest, speaks in single words, struggling to breathe, sitting hunched forward, retractions, pulse > 120      With coughiing 5. RECURRENT SYMPTOM: "Have you had difficulty breathing before?" If Yes, ask: "When was the last time?" and "What happened that time?"      YES 6. CARDIAC HISTORY: "Do you have any history of heart disease?" (e.g., heart  attack, angina, bypass surgery, angioplasty)       7. LUNG HISTORY: "Do you have any history of lung disease?"  (e.g., pulmonary embolus, asthma, emphysema)     Asthma 8. CAUSE: "What do you think is causing the breathing problem?"       9. OTHER SYMPTOMS: "Do you have any other symptoms? (e.g., dizziness, runny nose, cough, chest pain, fever)     Cough, "Feel hot, sweating" increased fatigue, chest tight 10. O2 SATURATION MONITOR:  "Do you use an oxygen saturation monitor (pulse oximeter) at home?" If Yes, ask: "What is your reading (oxygen level) today?" "What is your usual oxygen saturation reading?" (e.g., 95%)  Protocols used: Breathing Difficulty-A-AH

## 2022-12-08 NOTE — Assessment & Plan Note (Addendum)
Recent episode treated with steroids and zpak Felt better on steroids but now symptoms are returning Will repeat longer taper, add maintenance inhaler, CXR She will continue nebulizer and MDI PRN

## 2022-12-08 NOTE — Progress Notes (Signed)
Date:  12/08/2022   Name:  Susan Durham   DOB:  1971-05-21   MRN:  [id number]   Chief Complaint: Asthma (Pt came in the office last week and was given abx. Completed abx Tuesday. Still having tightness in chest and sob when walking. )  Asthma She complains of chest tightness, difficulty breathing and shortness of breath. There is no cough, sputum production or wheezing. This is a recurrent problem. The problem has been waxing and waning (felt better after steroids but now recurrent). Pertinent negatives include no fever, headaches or trouble swallowing. Her symptoms are alleviated by beta-agonist (but does not last). Her past medical history is significant for asthma.    Lab Results  Component Value Date   NA 140 10/19/2021   K 4.5 10/19/2021   CO2 23 10/19/2021   GLUCOSE 103 (H) 10/19/2021   BUN 12 10/19/2021   CREATININE 0.66 10/19/2021   CALCIUM 9.6 10/19/2021   EGFR 106 10/19/2021   GFRNONAA 75 09/17/2019   Lab Results  Component Value Date   CHOL 182 10/19/2021   HDL 37 (L) 10/19/2021   LDLCALC 88 10/19/2021   TRIG 346 (H) 10/19/2021   CHOLHDL 4.9 (H) 10/19/2021   Lab Results  Component Value Date   TSH 3.250 10/19/2021   Lab Results  Component Value Date   HGBA1C 5.2 10/19/2021   Lab Results  Component Value Date   WBC 6.0 10/19/2021   HGB 13.2 10/19/2021   HCT 40.5 10/19/2021   MCV 88 10/19/2021   PLT 279 10/19/2021   Lab Results  Component Value Date   ALT 24 10/19/2021   AST 20 10/19/2021   ALKPHOS 68 10/19/2021   BILITOT 0.2 10/19/2021   Lab Results  Component Value Date   VD25OH 33.2 10/19/2021     Review of Systems  Constitutional:  Negative for chills, diaphoresis, fatigue and fever.  HENT:  Negative for trouble swallowing.   Respiratory:  Positive for chest tightness and shortness of breath. Negative for cough, sputum production and wheezing.   Gastrointestinal:  Negative for nausea and vomiting.  Neurological:  Positive for  light-headedness. Negative for dizziness and headaches.  Psychiatric/Behavioral:  Negative for sleep disturbance.     Patient Active Problem List   Diagnosis Date Noted   Mixed hyperlipidemia 10/19/2021   Vitamin D deficiency 10/19/2021   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/07/2020   Essential hypertension 07/07/2020   Sciatica, right side 07/07/2020   Carpal tunnel syndrome on right 07/07/2020   Mild intermittent asthma without complication 07/07/2020    Allergies  Allergen Reactions   Morphine And Related Anaphylaxis   Hydrochlorothiazide Other (See Comments)    headache   Tramadol Other (See Comments)    anxiety    Past Surgical History:  Procedure Laterality Date   TOTAL ABDOMINAL HYSTERECTOMY      Social History   Tobacco Use   Smoking status: Former    Packs/day: 0.50    Years: 30.00    Additional pack years: 0.00    Total pack years: 15.00    Types: Cigarettes    Quit date: 10/30/2018    Years since quitting: 4.1   Smokeless tobacco: Never  Vaping Use   Vaping Use: Never used  Substance Use Topics   Alcohol use: Never   Drug use: Never     Medication list has been reviewed and updated.  Current Meds  Medication Sig   albuterol (PROVENTIL) (2.5 MG/3ML) 0.083% nebulizer solution Take 3 mLs (  2.5 mg total) by nebulization every 6 (six) hours as needed for wheezing or shortness of breath.   albuterol (VENTOLIN HFA) 108 (90 Base) MCG/ACT inhaler TAKE 2 PUFFS BY MOUTH EVERY 6 HOURS AS NEEDED FOR WHEEZE OR SHORTNESS OF BREATH   cyclobenzaprine (FLEXERIL) 10 MG tablet Take 1 tablet (10 mg total) by mouth 3 (three) times daily as needed for muscle spasms.   estradiol (ESTRACE) 2 MG tablet TAKE 1 TABLET BY MOUTH EVERY DAY   fluticasone-salmeterol (ADVAIR) 100-50 MCG/ACT AEPB Inhale 1 puff into the lungs 2 (two) times daily.   ibuprofen (ADVIL) 800 MG tablet Take 1 tablet (800 mg total) by mouth every 8 (eight) hours as needed.   lisinopril (ZESTRIL) 20 MG tablet  TAKE 1 TABLET BY MOUTH EVERY DAY   meloxicam (MOBIC) 15 MG tablet TAKE 1 TABLET (15 MG TOTAL) BY MOUTH DAILY.   metaxalone (SKELAXIN) 800 MG tablet Take 1 tablet (800 mg total) by mouth 3 (three) times daily.   predniSONE (DELTASONE) 10 MG tablet Take 6 tablets (60 mg total) by mouth daily with breakfast for 2 days, THEN 5 tablets (50 mg total) daily with breakfast for 2 days, THEN 4 tablets (40 mg total) daily with breakfast for 2 days, THEN 3 tablets (30 mg total) daily with breakfast for 2 days, THEN 2 tablets (20 mg total) daily with breakfast for 2 days, THEN 1 tablet (10 mg total) daily with breakfast for 2 days.   [DISCONTINUED] promethazine-dextromethorphan (PROMETHAZINE-DM) 6.25-15 MG/5ML syrup Take 5 mLs by mouth 4 (four) times daily as needed for up to 9 days for cough.       12/08/2022    9:10 AM 11/30/2022    4:21 PM 06/14/2022    3:36 PM 10/19/2021    1:37 PM  GAD 7 : Generalized Anxiety Score  Nervous, Anxious, on Edge 0 0 0 0  Control/stop worrying 0 0 0 0  Worry too much - different things 0 0 0 0  Trouble relaxing 0 0 0 0  Restless 0 0 0 0  Easily annoyed or irritable 0 0 0 0  Afraid - awful might happen 0 0 0 0  Total GAD 7 Score 0 0 0 0  Anxiety Difficulty Not difficult at all Not difficult at all Not difficult at all Not difficult at all       12/08/2022    9:10 AM 11/30/2022    4:21 PM 06/14/2022    3:36 PM  Depression screen PHQ 2/9  Decreased Interest 0 0 0  Down, Depressed, Hopeless 0 0 0  PHQ - 2 Score 0 0 0  Altered sleeping 0 0 0  Tired, decreased energy 0 0 0  Change in appetite 0 0 0  Feeling bad or failure about yourself  0 0 0  Trouble concentrating 0 0 0  Moving slowly or fidgety/restless 0 0 0  Suicidal thoughts 0 0 0  PHQ-9 Score 0 0 0  Difficult doing work/chores Not difficult at all Not difficult at all Not difficult at all    BP Readings from Last 3 Encounters:  12/08/22 128/70  11/30/22 120/74  07/12/22 (!) 150/86    Physical  Exam Vitals and nursing note reviewed.  Constitutional:      General: She is not in acute distress.    Appearance: Normal appearance. She is well-developed.  HENT:     Head: Normocephalic and atraumatic.  Cardiovascular:     Rate and Rhythm: Normal rate and regular rhythm.  Pulmonary:     Effort: Pulmonary effort is normal. No respiratory distress.     Breath sounds: Normal breath sounds. No wheezing or rhonchi.  Musculoskeletal:     Cervical back: Normal range of motion.  Lymphadenopathy:     Cervical: No cervical adenopathy.  Skin:    General: Skin is warm and dry.     Findings: No rash.  Neurological:     Mental Status: She is alert and oriented to person, place, and time.  Psychiatric:        Mood and Affect: Mood normal.        Behavior: Behavior normal.     Wt Readings from Last 3 Encounters:  12/08/22 172 lb (78 kg)  11/30/22 172 lb 6.4 oz (78.2 kg)  07/12/22 160 lb (72.6 kg)    BP 128/70   Pulse 75   Ht 5\' 4"  (1.626 m)   Wt 172 lb (78 kg)   SpO2 98%   BMI 29.52 kg/m   Assessment and Plan:  Problem List Items Addressed This Visit       Respiratory   Mild intermittent asthma without complication - Primary (Chronic)    Recent episode treated with steroids and zpak Felt better on steroids but now symptoms are returning Will repeat longer taper, add maintenance inhaler, CXR She will continue nebulizer and MDI PRN      Relevant Medications   predniSONE (DELTASONE) 10 MG tablet   fluticasone-salmeterol (ADVAIR) 100-50 MCG/ACT AEPB   Other Relevant Orders   DG Chest 2 View    Return if symptoms worsen or fail to improve.   Partially dictated using Dragon software, any errors are not intentional.  Reubin Milan, MD [facility] [place], [place]

## 2023-01-16 ENCOUNTER — Encounter: Payer: Self-pay | Admitting: Internal Medicine

## 2023-01-16 ENCOUNTER — Ambulatory Visit (INDEPENDENT_AMBULATORY_CARE_PROVIDER_SITE_OTHER): Payer: BC Managed Care – PPO | Admitting: Internal Medicine

## 2023-01-16 VITALS — BP 128/78 | HR 67 | Ht 64.0 in | Wt 177.0 lb

## 2023-01-16 DIAGNOSIS — R300 Dysuria: Secondary | ICD-10-CM | POA: Diagnosis not present

## 2023-01-16 DIAGNOSIS — J0100 Acute maxillary sinusitis, unspecified: Secondary | ICD-10-CM | POA: Diagnosis not present

## 2023-01-16 DIAGNOSIS — R399 Unspecified symptoms and signs involving the genitourinary system: Secondary | ICD-10-CM

## 2023-01-16 LAB — POCT URINALYSIS DIPSTICK
Bilirubin, UA: NEGATIVE
Blood, UA: NEGATIVE
Glucose, UA: NEGATIVE
Ketones, UA: NEGATIVE
Nitrite, UA: POSITIVE
Protein, UA: NEGATIVE
Spec Grav, UA: 1.01 (ref 1.010–1.025)
Urobilinogen, UA: 0.2 E.U./dL
pH, UA: 6.5 (ref 5.0–8.0)

## 2023-01-16 MED ORDER — CEFUROXIME AXETIL 500 MG PO TABS
500.0000 mg | ORAL_TABLET | Freq: Two times a day (BID) | ORAL | 0 refills | Status: AC
Start: 2023-01-16 — End: 2023-01-26

## 2023-01-16 NOTE — Patient Instructions (Signed)
Continue Allegra and Nasonex  Add Coricidin HBP for nasal congestion

## 2023-01-16 NOTE — Progress Notes (Signed)
Date:  01/16/2023   Name:  Susan Durham   DOB:  [birth year]   MRN:  [id number]   Chief Complaint: Urinary Tract Infection  Urinary Tract Infection  This is a new problem. The current episode started yesterday. The problem occurs every urination. The problem has been gradually worsening. The quality of the pain is described as aching, burning, shooting and stabbing. The pain is at a severity of 10/10. The pain is moderate. There has been no fever. She is Not sexually active. There is No history of pyelonephritis. Associated symptoms include frequency. Pertinent negatives include no chills or hematuria. Associated symptoms comments: Burning when urinating . She has tried nothing for the symptoms.  Sinus Problem This is a recurrent problem. The problem has been gradually worsening since onset. There has been no fever. Associated symptoms include congestion, headaches and sinus pressure. Pertinent negatives include no chills or shortness of breath. Past treatments include oral decongestants and nasal decongestants. The treatment provided moderate relief.    Lab Results  Component Value Date   NA 140 10/19/2021   K 4.5 10/19/2021   CO2 23 10/19/2021   GLUCOSE 103 (H) 10/19/2021   BUN 12 10/19/2021   CREATININE 0.66 10/19/2021   CALCIUM 9.6 10/19/2021   EGFR 106 10/19/2021   GFRNONAA 75 09/17/2019   Lab Results  Component Value Date   CHOL 182 10/19/2021   HDL 37 (L) 10/19/2021   LDLCALC 88 10/19/2021   TRIG 346 (H) 10/19/2021   CHOLHDL 4.9 (H) 10/19/2021   Lab Results  Component Value Date   TSH 3.250 10/19/2021   Lab Results  Component Value Date   HGBA1C 5.2 10/19/2021   Lab Results  Component Value Date   WBC 6.0 10/19/2021   HGB 13.2 10/19/2021   HCT 40.5 10/19/2021   MCV 88 10/19/2021   PLT 279 10/19/2021   Lab Results  Component Value Date   ALT 24 10/19/2021   AST 20 10/19/2021   ALKPHOS 68 10/19/2021   BILITOT 0.2 10/19/2021   Lab Results  Component Value  Date   VD25OH 33.2 10/19/2021     Review of Systems  Constitutional:  Negative for chills, fatigue and fever.  HENT:  Positive for congestion and sinus pressure. Negative for trouble swallowing.   Respiratory:  Negative for chest tightness and shortness of breath.   Cardiovascular:  Negative for chest pain.  Genitourinary:  Positive for dysuria and frequency. Negative for hematuria.  Neurological:  Positive for headaches.  Psychiatric/Behavioral:  Negative for dysphoric mood and sleep disturbance. The patient is not nervous/anxious.     Patient Active Problem List   Diagnosis Date Noted   Mixed hyperlipidemia 10/19/2021   Vitamin D deficiency 10/19/2021   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/07/2020   Essential hypertension 07/07/2020   Sciatica, right side 07/07/2020   Carpal tunnel syndrome on right 07/07/2020   Mild intermittent asthma without complication 07/07/2020    Allergies  Allergen Reactions   Morphine And Codeine Anaphylaxis   Hydrochlorothiazide Other (See Comments)    headache   Tramadol Other (See Comments)    anxiety    Past Surgical History:  Procedure Laterality Date   TOTAL ABDOMINAL HYSTERECTOMY      Social History   Tobacco Use   Smoking status: Former    Packs/day: 0.50    Years: 30.00    Additional pack years: 0.00    Total pack years: 15.00    Types: Cigarettes    Quit date:  10/30/2018    Years since quitting: 4.2   Smokeless tobacco: Never  Vaping Use   Vaping Use: Never used  Substance Use Topics   Alcohol use: Never   Drug use: Never     Medication list has been reviewed and updated.  Current Meds  Medication Sig   albuterol (PROVENTIL) (2.5 MG/3ML) 0.083% nebulizer solution Take 3 mLs (2.5 mg total) by nebulization every 6 (six) hours as needed for wheezing or shortness of breath.   albuterol (VENTOLIN HFA) 108 (90 Base) MCG/ACT inhaler TAKE 2 PUFFS BY MOUTH EVERY 6 HOURS AS NEEDED FOR WHEEZE OR SHORTNESS OF BREATH   cefUROXime  (CEFTIN) 500 MG tablet Take 1 tablet (500 mg total) by mouth 2 (two) times daily with a meal for 10 days.   cyclobenzaprine (FLEXERIL) 10 MG tablet Take 1 tablet (10 mg total) by mouth 3 (three) times daily as needed for muscle spasms.   estradiol (ESTRACE) 2 MG tablet TAKE 1 TABLET BY MOUTH EVERY DAY   fluticasone-salmeterol (ADVAIR) 100-50 MCG/ACT AEPB Inhale 1 puff into the lungs 2 (two) times daily.   ibuprofen (ADVIL) 800 MG tablet Take 1 tablet (800 mg total) by mouth every 8 (eight) hours as needed.   lisinopril (ZESTRIL) 20 MG tablet TAKE 1 TABLET BY MOUTH EVERY DAY   meloxicam (MOBIC) 15 MG tablet TAKE 1 TABLET (15 MG TOTAL) BY MOUTH DAILY.   metaxalone (SKELAXIN) 800 MG tablet Take 1 tablet (800 mg total) by mouth 3 (three) times daily.       01/16/2023    4:21 PM 12/08/2022    9:10 AM 11/30/2022    4:21 PM 06/14/2022    3:36 PM  GAD 7 : Generalized Anxiety Score  Nervous, Anxious, on Edge 0 0 0 0  Control/stop worrying 0 0 0 0  Worry too much - different things 0 0 0 0  Trouble relaxing 0 0 0 0  Restless 0 0 0 0  Easily annoyed or irritable 0 0 0 0  Afraid - awful might happen 0 0 0 0  Total GAD 7 Score 0 0 0 0  Anxiety Difficulty Not difficult at all Not difficult at all Not difficult at all Not difficult at all       01/16/2023    4:21 PM 12/08/2022    9:10 AM 11/30/2022    4:21 PM  Depression screen PHQ 2/9  Decreased Interest 0 0 0  Down, Depressed, Hopeless 0 0 0  PHQ - 2 Score 0 0 0  Altered sleeping 0 0 0  Tired, decreased energy 0 0 0  Change in appetite 0 0 0  Feeling bad or failure about yourself  0 0 0  Trouble concentrating 0 0 0  Moving slowly or fidgety/restless 0 0 0  Suicidal thoughts 0 0 0  PHQ-9 Score 0 0 0  Difficult doing work/chores Not difficult at all Not difficult at all Not difficult at all    BP Readings from Last 3 Encounters:  01/16/23 128/78  12/08/22 128/70  11/30/22 120/74    Physical Exam Vitals and nursing note reviewed.   Constitutional:      Appearance: She is well-developed.  HENT:     Right Ear: Tympanic membrane is not erythematous or retracted.     Left Ear: Tympanic membrane is not erythematous or retracted.     Nose:     Right Sinus: Maxillary sinus tenderness and frontal sinus tenderness present.     Left Sinus: Maxillary  sinus tenderness and frontal sinus tenderness present.     Mouth/Throat:     Mouth: No oral lesions.     Pharynx: Uvula midline. No posterior oropharyngeal erythema.  Cardiovascular:     Rate and Rhythm: Normal rate and regular rhythm.     Heart sounds: Normal heart sounds.  Pulmonary:     Effort: Pulmonary effort is normal. No respiratory distress.     Breath sounds: Normal breath sounds. No wheezing or rales.  Abdominal:     General: Bowel sounds are normal.     Palpations: Abdomen is soft.     Tenderness: There is abdominal tenderness in the suprapubic area. There is no right CVA tenderness, left CVA tenderness, guarding or rebound.  Lymphadenopathy:     Cervical: No cervical adenopathy.  Neurological:     Mental Status: She is alert and oriented to person, place, and time.     Wt Readings from Last 3 Encounters:  01/16/23 177 lb (80.3 kg)  12/08/22 172 lb (78 kg)  11/30/22 172 lb 6.4 oz (78.2 kg)    BP 128/78   Pulse 67   Ht 5\' 4"  (1.626 m)   Wt 177 lb (80.3 kg)   SpO2 98%   BMI 30.38 kg/m   Assessment and Plan:  Problem List Items Addressed This Visit   None Visit Diagnoses     Dysuria    -  Primary   UA positive will treat with Ceftin to also cover sinusitis   Relevant Medications   cefUROXime (CEFTIN) 500 MG tablet   Other Relevant Orders   POCT urinalysis dipstick (Completed)   Urine Culture   UTI symptoms       Relevant Orders   Urine Culture   Acute non-recurrent maxillary sinusitis       continue Allegra and Nasonex add coricidin HBP   Relevant Medications   cefUROXime (CEFTIN) 500 MG tablet       No follow-ups on file.    Partially dictated using Dragon software, any errors are not intentional.  Reubin Milan, MD [facility] [place], [place]

## 2023-01-18 LAB — URINE CULTURE

## 2023-02-21 ENCOUNTER — Other Ambulatory Visit: Payer: Self-pay | Admitting: Internal Medicine

## 2023-02-21 DIAGNOSIS — I10 Essential (primary) hypertension: Secondary | ICD-10-CM

## 2023-03-23 ENCOUNTER — Other Ambulatory Visit: Payer: Self-pay | Admitting: Internal Medicine

## 2023-03-23 DIAGNOSIS — I10 Essential (primary) hypertension: Secondary | ICD-10-CM

## 2023-03-23 NOTE — Telephone Encounter (Signed)
Requested Prescriptions  Pending Prescriptions Disp Refills   lisinopril (ZESTRIL) 20 MG tablet [Pharmacy Med Name: LISINOPRIL 20 MG TABLET] 90 tablet 0    Sig: TAKE 1 TABLET BY MOUTH EVERY DAY     Cardiovascular:  ACE Inhibitors Failed - 03/23/2023  5:19 AM      Failed - Cr in normal range and within 180 days    Creatinine, Ser  Date Value Ref Range Status  10/19/2021 0.66 0.57 - 1.00 mg/dL Final         Failed - K in normal range and within 180 days    Potassium  Date Value Ref Range Status  10/19/2021 4.5 3.5 - 5.2 mmol/L Final         Passed - Patient is not pregnant      Passed - Last BP in normal range    BP Readings from Last 1 Encounters:  01/16/23 128/78         Passed - Valid encounter within last 6 months    Recent Outpatient Visits           2 months ago Dysuria   [place] [facility] at [facility], Nyoka Cowden, MD   3 months ago Mild intermittent asthma without complication   [place] [facility] at [facility], Nyoka Cowden, MD   3 months ago Mild intermittent asthma with exacerbation   [facility] at [facility], Nyoka Cowden, MD   8 months ago Acute midline low back pain without sciatica   [facility] at [facility], Nyoka Cowden, MD   9 months ago Essential hypertension   [facility] at [facility], Nyoka Cowden, MD       Future Appointments             In 4 months Judithann Graves, Nyoka Cowden, MD [facility] at [facility], [facility]

## 2023-04-03 ENCOUNTER — Other Ambulatory Visit: Payer: Self-pay | Admitting: Internal Medicine

## 2023-04-03 DIAGNOSIS — J4520 Mild intermittent asthma, uncomplicated: Secondary | ICD-10-CM

## 2023-04-05 ENCOUNTER — Ambulatory Visit: Payer: BC Managed Care – PPO | Admitting: Internal Medicine

## 2023-04-05 ENCOUNTER — Encounter: Payer: Self-pay | Admitting: Internal Medicine

## 2023-04-05 ENCOUNTER — Ambulatory Visit: Payer: Self-pay | Admitting: *Deleted

## 2023-04-05 ENCOUNTER — Other Ambulatory Visit: Payer: Self-pay | Admitting: Internal Medicine

## 2023-04-05 VITALS — BP 118/78 | HR 70 | Ht 64.0 in | Wt 176.0 lb

## 2023-04-05 DIAGNOSIS — N3000 Acute cystitis without hematuria: Secondary | ICD-10-CM

## 2023-04-05 LAB — POCT URINALYSIS DIPSTICK
Bilirubin, UA: NEGATIVE
Blood, UA: NEGATIVE
Glucose, UA: NEGATIVE
Ketones, UA: NEGATIVE
Nitrite, UA: NEGATIVE
Protein, UA: NEGATIVE
Spec Grav, UA: 1.02 (ref 1.010–1.025)
Urobilinogen, UA: 0.2 E.U./dL
pH, UA: 6 (ref 5.0–8.0)

## 2023-04-05 MED ORDER — SULFAMETHOXAZOLE-TRIMETHOPRIM 800-160 MG PO TABS
1.0000 | ORAL_TABLET | Freq: Two times a day (BID) | ORAL | 0 refills | Status: AC
Start: 2023-04-05 — End: 2023-04-12

## 2023-04-05 NOTE — Telephone Encounter (Signed)
  Chief Complaint: urinary pain Symptoms: pain, frequency, urgency  Frequency: started early this morning Pertinent Negatives: Patient denies fever Disposition: [] ED /[] Urgent Care (no appt availability in office) / [x] Appointment(In office/virtual)/ []  [facility]/ [] Home Care/ [] Refused Recommended Disposition /[] [facility]/ []  Follow-up with PCP Additional Notes: Patient is going out of town and is at work- she has been scheduled for last appointment of the day.  Patient advised increased water, tylenol, OTC bladder numbing medication if needed.

## 2023-04-05 NOTE — Telephone Encounter (Signed)
Summary: burning/hurting when urinating. UTI?   Pt is calling requesting Prednisone 10mg  to be called into her pharmacy. Per pt she has a UTI. Symptoms: burning and hurting when urinating. Pt states that she does not have time to see her PCP today due to her leaving tomorrow to go to [place]    [facility] [phone] - [place], [place] - [address] Phone: [phone] Fax: [phone]         Reason for Disposition  [1] SEVERE pain with urination (e.g., excruciating) AND [2] not improved after 2 hours of pain medicine and Sitz bath  Answer Assessment - Initial Assessment Questions 1. SEVERITY: "How bad is the pain?"  (e.g., Scale 1-10; mild, moderate, or severe)   - MILD (1-3): complains slightly about urination hurting   - MODERATE (4-7): interferes with normal activities     - SEVERE (8-10): excruciating, unwilling or unable to urinate because of the pain      severe 2. FREQUENCY: "How many times have you had painful urination today?"      5 times- little bit of pee comes out- burns 3. PATTERN: "Is pain present every time you urinate or just sometimes?"      Every time 4. ONSET: "When did the painful urination start?"      3 am today 5. FEVER: "Do you have a fever?" If Yes, ask: "What is your temperature, how was it measured, and when did it start?"     no 6. PAST UTI: "Have you had a urine infection before?" If Yes, ask: "When was the last time?" and "What happened that time?"      Yes- 1 month ago- blood in urine 7. CAUSE: "What do you think is causing the painful urination?"  (e.g., UTI, scratch, Herpes sore)     UTI 8. OTHER SYMPTOMS: "Do you have any other symptoms?" (e.g., blood in urine, flank pain, genital sores, urgency, vaginal discharge)     Pressure, frequency, burning  Protocols used: Urination Pain - Female-A-AH

## 2023-04-05 NOTE — Progress Notes (Signed)
Date:  04/05/2023   Name:  Susan Durham   DOB:  07-03-1971   MRN:  [id number]   Chief Complaint: Urinary Tract Infection (Pain while urinating. Started this morning at 3am. )  Urinary Tract Infection  This is a new problem. The current episode started today. The problem occurs every urination. The problem has been unchanged. The quality of the pain is described as burning. The pain is mild. There has been no fever. Associated symptoms include urgency. Pertinent negatives include no chills, hematuria, nausea or vomiting.    Lab Results  Component Value Date   NA 140 10/19/2021   K 4.5 10/19/2021   CO2 23 10/19/2021   GLUCOSE 103 (H) 10/19/2021   BUN 12 10/19/2021   CREATININE 0.66 10/19/2021   CALCIUM 9.6 10/19/2021   EGFR 106 10/19/2021   GFRNONAA 75 09/17/2019   Lab Results  Component Value Date   CHOL 182 10/19/2021   HDL 37 (L) 10/19/2021   LDLCALC 88 10/19/2021   TRIG 346 (H) 10/19/2021   CHOLHDL 4.9 (H) 10/19/2021   Lab Results  Component Value Date   TSH 3.250 10/19/2021   Lab Results  Component Value Date   HGBA1C 5.2 10/19/2021   Lab Results  Component Value Date   WBC 6.0 10/19/2021   HGB 13.2 10/19/2021   HCT 40.5 10/19/2021   MCV 88 10/19/2021   PLT 279 10/19/2021   Lab Results  Component Value Date   ALT 24 10/19/2021   AST 20 10/19/2021   ALKPHOS 68 10/19/2021   BILITOT 0.2 10/19/2021   Lab Results  Component Value Date   VD25OH 33.2 10/19/2021     Review of Systems  Constitutional:  Negative for chills and fatigue.  Respiratory:  Negative for chest tightness and shortness of breath.   Cardiovascular:  Negative for chest pain.  Gastrointestinal:  Negative for abdominal pain, nausea and vomiting.  Genitourinary:  Positive for dysuria and urgency. Negative for hematuria.    Patient Active Problem List   Diagnosis Date Noted   Mixed hyperlipidemia 10/19/2021   Vitamin D deficiency 10/19/2021   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/07/2020    Essential hypertension 07/07/2020   Sciatica, right side 07/07/2020   Carpal tunnel syndrome on right 07/07/2020   Mild intermittent asthma without complication 07/07/2020    Allergies  Allergen Reactions   Morphine And Codeine Anaphylaxis   Hydrochlorothiazide Other (See Comments)    headache   Tramadol Other (See Comments)    anxiety    Past Surgical History:  Procedure Laterality Date   TOTAL ABDOMINAL HYSTERECTOMY      Social History   Tobacco Use   Smoking status: Former    Current packs/day: 0.00    Average packs/day: 0.5 packs/day for 30.0 years (15.0 ttl pk-yrs)    Types: Cigarettes    Start date: 10/29/1988    Quit date: 10/30/2018    Years since quitting: 4.4   Smokeless tobacco: Never  Vaping Use   Vaping status: Never Used  Substance Use Topics   Alcohol use: Never   Drug use: Never     Medication list has been reviewed and updated.  Current Meds  Medication Sig   albuterol (PROVENTIL) (2.5 MG/3ML) 0.083% nebulizer solution Take 3 mLs (2.5 mg total) by nebulization every 6 (six) hours as needed for wheezing or shortness of breath.   albuterol (VENTOLIN HFA) 108 (90 Base) MCG/ACT inhaler TAKE 2 PUFFS BY MOUTH EVERY 6 HOURS AS NEEDED FOR WHEEZE  OR SHORTNESS OF BREATH   cyclobenzaprine (FLEXERIL) 10 MG tablet Take 1 tablet (10 mg total) by mouth 3 (three) times daily as needed for muscle spasms.   estradiol (ESTRACE) 2 MG tablet TAKE 1 TABLET BY MOUTH EVERY DAY   fluticasone-salmeterol (ADVAIR) 100-50 MCG/ACT AEPB Inhale 1 puff into the lungs 2 (two) times daily.   ibuprofen (ADVIL) 800 MG tablet Take 1 tablet (800 mg total) by mouth every 8 (eight) hours as needed.   lisinopril (ZESTRIL) 20 MG tablet TAKE 1 TABLET BY MOUTH EVERY DAY   meloxicam (MOBIC) 15 MG tablet TAKE 1 TABLET (15 MG TOTAL) BY MOUTH DAILY.   metaxalone (SKELAXIN) 800 MG tablet Take 1 tablet (800 mg total) by mouth 3 (three) times daily.   sulfamethoxazole-trimethoprim (BACTRIM DS)  800-160 MG tablet Take 1 tablet by mouth 2 (two) times daily for 7 days.       01/16/2023    4:21 PM 12/08/2022    9:10 AM 11/30/2022    4:21 PM 06/14/2022    3:36 PM  GAD 7 : Generalized Anxiety Score  Nervous, Anxious, on Edge 0 0 0 0  Control/stop worrying 0 0 0 0  Worry too much - different things 0 0 0 0  Trouble relaxing 0 0 0 0  Restless 0 0 0 0  Easily annoyed or irritable 0 0 0 0  Afraid - awful might happen 0 0 0 0  Total GAD 7 Score 0 0 0 0  Anxiety Difficulty Not difficult at all Not difficult at all Not difficult at all Not difficult at all       01/16/2023    4:21 PM 12/08/2022    9:10 AM 11/30/2022    4:21 PM  Depression screen PHQ 2/9  Decreased Interest 0 0 0  Down, Depressed, Hopeless 0 0 0  PHQ - 2 Score 0 0 0  Altered sleeping 0 0 0  Tired, decreased energy 0 0 0  Change in appetite 0 0 0  Feeling bad or failure about yourself  0 0 0  Trouble concentrating 0 0 0  Moving slowly or fidgety/restless 0 0 0  Suicidal thoughts 0 0 0  PHQ-9 Score 0 0 0  Difficult doing work/chores Not difficult at all Not difficult at all Not difficult at all    BP Readings from Last 3 Encounters:  04/05/23 118/78  01/16/23 128/78  12/08/22 128/70    Physical Exam Vitals and nursing note reviewed.  Constitutional:      Appearance: She is well-developed.  Cardiovascular:     Rate and Rhythm: Normal rate and regular rhythm.     Heart sounds: Normal heart sounds.  Pulmonary:     Effort: Pulmonary effort is normal. No respiratory distress.     Breath sounds: Normal breath sounds.  Abdominal:     General: Bowel sounds are normal.     Palpations: Abdomen is soft.     Tenderness: There is abdominal tenderness in the suprapubic area. There is no right CVA tenderness, left CVA tenderness, guarding or rebound.   Urine dipstick shows positive for leukocytes.  Micro exam: not done.   Wt Readings from Last 3 Encounters:  04/05/23 176 lb (79.8 kg)  01/16/23 177 lb (80.3 kg)   12/08/22 172 lb (78 kg)    BP 118/78   Pulse 70   Ht 5\' 4"  (1.626 m)   Wt 176 lb (79.8 kg)   SpO2 97%   BMI 30.21 kg/m  Assessment and Plan:  Problem List Items Addressed This Visit   None Visit Diagnoses     Acute cystitis without hematuria    -  Primary   Push fluids can take AZO bid for 3 days if needed   Relevant Medications   sulfamethoxazole-trimethoprim (BACTRIM DS) 800-160 MG tablet   Other Relevant Orders   POCT urinalysis dipstick (Completed)   Urine Culture       No follow-ups on file.    Reubin Milan, MD [facility] [place]

## 2023-04-05 NOTE — Patient Instructions (Signed)
AZO for bladder discomfort

## 2023-06-21 ENCOUNTER — Other Ambulatory Visit: Payer: Self-pay | Admitting: Internal Medicine

## 2023-06-21 DIAGNOSIS — I10 Essential (primary) hypertension: Secondary | ICD-10-CM

## 2023-08-04 ENCOUNTER — Encounter: Payer: BC Managed Care – PPO | Admitting: Internal Medicine

## 2023-09-16 ENCOUNTER — Other Ambulatory Visit: Payer: Self-pay | Admitting: Internal Medicine

## 2023-09-16 DIAGNOSIS — N951 Menopausal and female climacteric states: Secondary | ICD-10-CM

## 2023-09-18 NOTE — Telephone Encounter (Signed)
Requested Prescriptions  Pending Prescriptions Disp Refills   estradiol (ESTRACE) 2 MG tablet [Pharmacy Med Name: ESTRADIOL 2 MG TABLET] 90 tablet 0    Sig: TAKE 1 TABLET BY MOUTH EVERY DAY     OB/GYN:  Estrogens Passed - 09/18/2023 10:43 AM      Passed - Mammogram is up-to-date per Health Maintenance      Passed - Last BP in normal range    BP Readings from Last 1 Encounters:  04/05/23 118/78         Passed - Valid encounter within last 12 months    Recent Outpatient Visits           5 months ago Acute cystitis without hematuria   [place] [facility] at [facility], Nyoka Cowden, MD   8 months ago Dysuria   [facility] at [facility], Nyoka Cowden, MD   9 months ago Mild intermittent asthma without complication   [place] [facility] at [facility], Nyoka Cowden, MD   9 months ago Mild intermittent asthma with exacerbation   [facility] at [facility], Nyoka Cowden, MD   1 year ago Acute midline low back pain without sciatica   [facility] at [facility], Nyoka Cowden, MD

## 2023-12-08 ENCOUNTER — Ambulatory Visit (INDEPENDENT_AMBULATORY_CARE_PROVIDER_SITE_OTHER): Admitting: Internal Medicine

## 2023-12-08 ENCOUNTER — Encounter: Payer: Self-pay | Admitting: Internal Medicine

## 2023-12-08 VITALS — BP 118/76 | HR 91 | Temp 98.3°F | Ht 64.0 in | Wt 181.0 lb

## 2023-12-08 DIAGNOSIS — J4520 Mild intermittent asthma, uncomplicated: Secondary | ICD-10-CM

## 2023-12-08 DIAGNOSIS — J3489 Other specified disorders of nose and nasal sinuses: Secondary | ICD-10-CM | POA: Diagnosis not present

## 2023-12-08 DIAGNOSIS — Z1231 Encounter for screening mammogram for malignant neoplasm of breast: Secondary | ICD-10-CM | POA: Diagnosis not present

## 2023-12-08 DIAGNOSIS — J4521 Mild intermittent asthma with (acute) exacerbation: Secondary | ICD-10-CM

## 2023-12-08 MED ORDER — ALBUTEROL SULFATE HFA 108 (90 BASE) MCG/ACT IN AERS
INHALATION_SPRAY | RESPIRATORY_TRACT | 5 refills | Status: DC
Start: 2023-12-08 — End: 2024-06-04

## 2023-12-08 MED ORDER — MUPIROCIN 2 % EX OINT: 1.0000 | TOPICAL_OINTMENT | Freq: Two times a day (BID) | CUTANEOUS | 0 refills | Status: AC

## 2023-12-08 MED ORDER — PREDNISONE 10 MG PO TABS
ORAL_TABLET | ORAL | 0 refills | Status: AC
Start: 2023-12-08 — End: 2023-12-20

## 2023-12-08 MED ORDER — ALBUTEROL SULFATE (2.5 MG/3ML) 0.083% IN NEBU: 2.5000 mg | INHALATION_SOLUTION | Freq: Four times a day (QID) | RESPIRATORY_TRACT | 1 refills | Status: AC | PRN

## 2023-12-08 MED ORDER — AMOXICILLIN-POT CLAVULANATE 875-125 MG PO TABS
1.0000 | ORAL_TABLET | Freq: Two times a day (BID) | ORAL | 0 refills | Status: AC
Start: 2023-12-08 — End: 2023-12-18

## 2023-12-08 MED ORDER — FLUTICASONE-SALMETEROL 100-50 MCG/ACT IN AEPB: 1.0000 | INHALATION_SPRAY | Freq: Two times a day (BID) | RESPIRATORY_TRACT | 5 refills | Status: DC

## 2023-12-08 MED ORDER — PROMETHAZINE-DM 6.25-15 MG/5ML PO SYRP
5.0000 mL | ORAL_SOLUTION | Freq: Four times a day (QID) | ORAL | 0 refills | Status: AC | PRN
Start: 2023-12-08 — End: 2023-12-17

## 2023-12-08 MED ORDER — FEXOFENADINE HCL 60 MG PO TABS: 60.0000 mg | ORAL_TABLET | Freq: Two times a day (BID) | ORAL | 1 refills | Status: AC

## 2023-12-08 NOTE — Progress Notes (Signed)
 Date:  12/08/2023   Name:  Susan Durham   DOB:  April 28, 1971   MRN:  [id number]   Chief Complaint: Cough (2 weeks with cough, SOB, sore throat, nasal congestion) and Asthma  Cough This is a recurrent problem. The current episode started 1 to 4 weeks ago. The problem has been unchanged. The problem occurs every few minutes. The cough is Non-productive. Associated symptoms include postnasal drip, shortness of breath and wheezing. Pertinent negatives include no chest pain, chills, ear pain, fever, headaches or sore throat. Her past medical history is significant for asthma.  Asthma She complains of cough, shortness of breath and wheezing. Associated symptoms include postnasal drip. Pertinent negatives include no appetite change, chest pain, ear pain, fever, headaches, sore throat or trouble swallowing. Her past medical history is significant for asthma.  Nasal lesion - just inside right nares is a tender area without bleeding or drainage.  Review of Systems  Constitutional:  Negative for appetite change, chills, fatigue and fever.  HENT:  Positive for congestion and postnasal drip. Negative for ear pain, sore throat and trouble swallowing.   Eyes:  Negative for visual disturbance.  Respiratory:  Positive for cough, chest tightness, shortness of breath and wheezing.   Cardiovascular:  Negative for chest pain and palpitations.  Musculoskeletal:  Negative for arthralgias.  Neurological:  Negative for headaches.  Psychiatric/Behavioral:  Positive for sleep disturbance.      Lab Results  Component Value Date   NA 140 10/19/2021   K 4.5 10/19/2021   CO2 23 10/19/2021   GLUCOSE 103 (H) 10/19/2021   BUN 12 10/19/2021   CREATININE 0.66 10/19/2021   CALCIUM 9.6 10/19/2021   EGFR 106 10/19/2021   GFRNONAA 75 09/17/2019   Lab Results  Component Value Date   CHOL 182 10/19/2021   HDL 37 (L) 10/19/2021   LDLCALC 88 10/19/2021   TRIG 346 (H) 10/19/2021   CHOLHDL 4.9 (H) 10/19/2021   Lab  Results  Component Value Date   TSH 3.250 10/19/2021   Lab Results  Component Value Date   HGBA1C 5.2 10/19/2021   Lab Results  Component Value Date   WBC 6.0 10/19/2021   HGB 13.2 10/19/2021   HCT 40.5 10/19/2021   MCV 88 10/19/2021   PLT 279 10/19/2021   Lab Results  Component Value Date   ALT 24 10/19/2021   AST 20 10/19/2021   ALKPHOS 68 10/19/2021   BILITOT 0.2 10/19/2021   Lab Results  Component Value Date   VD25OH 33.2 10/19/2021     Patient Active Problem List   Diagnosis Date Noted   Mixed hyperlipidemia 10/19/2021   Vitamin D deficiency 10/19/2021   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/07/2020   Essential hypertension 07/07/2020   Sciatica, right side 07/07/2020   Carpal tunnel syndrome on right 07/07/2020   Mild intermittent asthma without complication 07/07/2020    Allergies  Allergen Reactions   Morphine And Codeine Anaphylaxis   Hydrochlorothiazide Other (See Comments)    headache   Tramadol Other (See Comments)    anxiety    Past Surgical History:  Procedure Laterality Date   TOTAL ABDOMINAL HYSTERECTOMY      Social History   Tobacco Use   Smoking status: Former    Current packs/day: 0.00    Average packs/day: 0.5 packs/day for 30.0 years (15.0 ttl pk-yrs)    Types: Cigarettes    Start date: 10/29/1988    Quit date: 10/30/2018    Years since quitting: 5.1  Smokeless tobacco: Never  Vaping Use   Vaping status: Never Used  Substance Use Topics   Alcohol use: Never   Drug use: Never     Medication list has been reviewed and updated.  Current Meds  Medication Sig   amoxicillin-clavulanate (AUGMENTIN) 875-125 MG tablet Take 1 tablet by mouth 2 (two) times daily for 10 days.   cyclobenzaprine (FLEXERIL) 10 MG tablet Take 1 tablet (10 mg total) by mouth 3 (three) times daily as needed for muscle spasms.   estradiol (ESTRACE) 2 MG tablet TAKE 1 TABLET BY MOUTH EVERY DAY   ibuprofen (ADVIL) 800 MG tablet Take 1 tablet (800 mg total) by  mouth every 8 (eight) hours as needed.   lisinopril (ZESTRIL) 20 MG tablet TAKE 1 TABLET BY MOUTH EVERY DAY   meloxicam (MOBIC) 15 MG tablet TAKE 1 TABLET (15 MG TOTAL) BY MOUTH DAILY.   metaxalone (SKELAXIN) 800 MG tablet Take 1 tablet (800 mg total) by mouth 3 (three) times daily.   mupirocin ointment (BACTROBAN) 2 % Apply 1 Application topically 2 (two) times daily.   Oxymetazoline HCl (MUCINEX NASAL SPRAY FULL FORCE NA) Place 0.05 sprays into the nose as needed.   Phenyleph-Doxylamine-DM-APAP (ALKA-SELTZER SINUS ALRGY COUGH) 5-6.25-10-325 MG CAPS Take by mouth as needed.   predniSONE (DELTASONE) 10 MG tablet Take 6 tablets (60 mg total) by mouth daily with breakfast for 2 days, THEN 5 tablets (50 mg total) daily with breakfast for 2 days, THEN 4 tablets (40 mg total) daily with breakfast for 2 days, THEN 3 tablets (30 mg total) daily with breakfast for 2 days, THEN 2 tablets (20 mg total) daily with breakfast for 2 days, THEN 1 tablet (10 mg total) daily with breakfast for 2 days.   promethazine-dextromethorphan (PROMETHAZINE-DM) 6.25-15 MG/5ML syrup Take 5 mLs by mouth 4 (four) times daily as needed for up to 9 days for cough.   [DISCONTINUED] albuterol (VENTOLIN HFA) 108 (90 Base) MCG/ACT inhaler TAKE 2 PUFFS BY MOUTH EVERY 6 HOURS AS NEEDED FOR WHEEZE OR SHORTNESS OF BREATH   [DISCONTINUED] fexofenadine (ALLEGRA) 60 MG tablet Take 60 mg by mouth 2 (two) times daily.   [DISCONTINUED] fluticasone-salmeterol (ADVAIR) 100-50 MCG/ACT AEPB Inhale 1 puff into the lungs 2 (two) times daily.       01/16/2023    4:21 PM 12/08/2022    9:10 AM 11/30/2022    4:21 PM 06/14/2022    3:36 PM  GAD 7 : Generalized Anxiety Score  Nervous, Anxious, on Edge 0 0 0 0  Control/stop worrying 0 0 0 0  Worry too much - different things 0 0 0 0  Trouble relaxing 0 0 0 0  Restless 0 0 0 0  Easily annoyed or irritable 0 0 0 0  Afraid - awful might happen 0 0 0 0  Total GAD 7 Score 0 0 0 0  Anxiety Difficulty Not  difficult at all Not difficult at all Not difficult at all Not difficult at all       01/16/2023    4:21 PM 12/08/2022    9:10 AM 11/30/2022    4:21 PM  Depression screen PHQ 2/9  Decreased Interest 0 0 0  Down, Depressed, Hopeless 0 0 0  PHQ - 2 Score 0 0 0  Altered sleeping 0 0 0  Tired, decreased energy 0 0 0  Change in appetite 0 0 0  Feeling bad or failure about yourself  0 0 0  Trouble concentrating 0 0 0  Moving slowly or fidgety/restless 0 0 0  Suicidal thoughts 0 0 0  PHQ-9 Score 0 0 0  Difficult doing work/chores Not difficult at all Not difficult at all Not difficult at all    BP Readings from Last 3 Encounters:  12/08/23 118/76  04/05/23 118/78  01/16/23 128/78    Physical Exam Vitals and nursing note reviewed.  Constitutional:      General: She is not in acute distress.    Appearance: Normal appearance. She is well-developed.  HENT:     Head: Normocephalic and atraumatic.     Right Ear: Tympanic membrane normal.     Left Ear: Tympanic membrane normal.     Nose:     Right Sinus: No maxillary sinus tenderness or frontal sinus tenderness.     Left Sinus: No maxillary sinus tenderness or frontal sinus tenderness.     Mouth/Throat:     Pharynx: No posterior oropharyngeal erythema.  Cardiovascular:     Rate and Rhythm: Normal rate and regular rhythm.  Pulmonary:     Effort: Pulmonary effort is normal. No respiratory distress.     Breath sounds: No decreased air movement. Examination of the right-upper field reveals wheezing. Examination of the left-upper field reveals wheezing. Wheezing present. No rhonchi.  Musculoskeletal:     Cervical back: Normal range of motion.  Lymphadenopathy:     Cervical: No cervical adenopathy.  Skin:    General: Skin is warm and dry.     Findings: No rash.  Neurological:     Mental Status: She is alert and oriented to person, place, and time.  Psychiatric:        Mood and Affect: Mood normal.        Behavior: Behavior normal.      Wt Readings from Last 3 Encounters:  12/08/23 181 lb (82.1 kg)  04/05/23 176 lb (79.8 kg)  01/16/23 177 lb (80.3 kg)    BP 118/76   Pulse 91   Temp 98.3 F (36.8 C)   Ht 5\' 4"  (1.626 m)   Wt 181 lb (82.1 kg)   SpO2 97%   BMI 31.07 kg/m   Assessment and Plan:  Problem List Items Addressed This Visit       Unprioritized   Mild intermittent asthma without complication (Chronic)   Worsening asthma symptoms over the past 2 weeks. Using albuterol regularly without improvement but out of [place], nebulizer solution      Relevant Medications   predniSONE (DELTASONE) 10 MG tablet   albuterol (PROVENTIL) (2.5 MG/3ML) 0.083% nebulizer solution   albuterol (VENTOLIN HFA) 108 (90 Base) MCG/ACT inhaler   fluticasone-salmeterol (ADVAIR) 100-50 MCG/ACT AEPB   Other Visit Diagnoses       Mild intermittent acute asthmatic bronchitis with acute exacerbation    -  Primary   Relevant Medications   amoxicillin-clavulanate (AUGMENTIN) 875-125 MG tablet   predniSONE (DELTASONE) 10 MG tablet   promethazine-dextromethorphan (PROMETHAZINE-DM) 6.25-15 MG/5ML syrup   albuterol (PROVENTIL) (2.5 MG/3ML) 0.083% nebulizer solution   albuterol (VENTOLIN HFA) 108 (90 Base) MCG/ACT inhaler   fluticasone-salmeterol (ADVAIR) 100-50 MCG/ACT AEPB     Lesion of nasal cavity       Relevant Medications   mupirocin ointment (BACTROBAN) 2 %     Encounter for screening mammogram for breast cancer       Relevant Orders   MM 3D SCREENING MAMMOGRAM BILATERAL BREAST       No follow-ups on file.    Reubin Milan, MD [place]  [facility] [place]

## 2023-12-08 NOTE — Patient Instructions (Addendum)
 Start the [place] once you take the last of tapering dose of prednisone.  Use Mupirocin topically twice a day until nasal lesion has healed.  Call [facility] to schedule your mammogram at [phone].

## 2023-12-08 NOTE — Assessment & Plan Note (Addendum)
 Worsening asthma symptoms over the past 2 weeks. Using albuterol regularly without improvement but out of [place], nebulizer solution

## 2023-12-09 ENCOUNTER — Other Ambulatory Visit: Payer: Self-pay | Admitting: Internal Medicine

## 2023-12-09 ENCOUNTER — Encounter

## 2023-12-09 ENCOUNTER — Encounter: Payer: Self-pay | Admitting: Internal Medicine

## 2023-12-09 DIAGNOSIS — I10 Essential (primary) hypertension: Secondary | ICD-10-CM

## 2023-12-11 ENCOUNTER — Encounter: Payer: Self-pay | Admitting: Internal Medicine

## 2023-12-11 ENCOUNTER — Ambulatory Visit: Admitting: Internal Medicine

## 2023-12-11 DIAGNOSIS — M545 Low back pain, unspecified: Secondary | ICD-10-CM

## 2023-12-11 MED ORDER — IBUPROFEN 800 MG PO TABS: 800.0000 mg | ORAL_TABLET | Freq: Three times a day (TID) | ORAL | 0 refills | Status: AC | PRN

## 2023-12-11 MED ORDER — CYCLOBENZAPRINE HCL 10 MG PO TABS: 10.0000 mg | ORAL_TABLET | Freq: Three times a day (TID) | ORAL | 0 refills | Status: DC | PRN

## 2023-12-11 NOTE — Telephone Encounter (Signed)
 Requested medication (s) are due for refill today: Yes  Requested medication (s) are on the active medication list: Yes  Last refill:  06/21/23  Future visit scheduled: No  Notes to clinic:  Unable to refill per protocol due to failed labs, no updated results.      Requested Prescriptions  Pending Prescriptions Disp Refills   lisinopril (ZESTRIL) 20 MG tablet [Pharmacy Med Name: LISINOPRIL 20 MG TABLET] 90 tablet 1    Sig: TAKE 1 TABLET BY MOUTH EVERY DAY     Cardiovascular:  ACE Inhibitors Failed - 12/11/2023  3:52 PM      Failed - Cr in normal range and within 180 days    Creatinine, Ser  Date Value Ref Range Status  10/19/2021 0.66 0.57 - 1.00 mg/dL Final         Failed - K in normal range and within 180 days    Potassium  Date Value Ref Range Status  10/19/2021 4.5 3.5 - 5.2 mmol/L Final         Failed - Valid encounter within last 6 months    Recent Outpatient Visits           Today Acute midline low back pain without sciatica   [place] [facility] at [facility], Chales [place], MD   3 days ago Mild intermittent acute asthmatic bronchitis with acute exacerbation   [facility] at [facility], Chales [place], MD              Passed - Patient is not pregnant      Passed - Last BP in normal range    BP Readings from Last 1 Encounters:  12/11/23 134/84

## 2023-12-11 NOTE — Progress Notes (Signed)
 Date:  12/11/2023   Name:  Kampbell Holaway   DOB:  Jun 22, 1971   MRN:  [id number]   Chief Complaint: Back Pain (Patient said she slipped on some dog urine on Saturday, stretched back and fell on her back)  Back Pain This is a new problem. Episode onset: 2 days ago. The problem occurs constantly. The problem is unchanged. The pain is present in the lumbar spine. The pain is moderate. Pertinent negatives include no chest pain, numbness or weakness.    Review of Systems  Constitutional:  Negative for chills and fatigue.  Respiratory:  Negative for chest tightness and shortness of breath.   Cardiovascular:  Negative for chest pain.  Musculoskeletal:  Positive for back pain and gait problem. Negative for myalgias.  Neurological:  Negative for tremors, weakness and numbness.     Lab Results  Component Value Date   NA 140 10/19/2021   K 4.5 10/19/2021   CO2 23 10/19/2021   GLUCOSE 103 (H) 10/19/2021   BUN 12 10/19/2021   CREATININE 0.66 10/19/2021   CALCIUM 9.6 10/19/2021   EGFR 106 10/19/2021   GFRNONAA 75 09/17/2019   Lab Results  Component Value Date   CHOL 182 10/19/2021   HDL 37 (L) 10/19/2021   LDLCALC 88 10/19/2021   TRIG 346 (H) 10/19/2021   CHOLHDL 4.9 (H) 10/19/2021   Lab Results  Component Value Date   TSH 3.250 10/19/2021   Lab Results  Component Value Date   HGBA1C 5.2 10/19/2021   Lab Results  Component Value Date   WBC 6.0 10/19/2021   HGB 13.2 10/19/2021   HCT 40.5 10/19/2021   MCV 88 10/19/2021   PLT 279 10/19/2021   Lab Results  Component Value Date   ALT 24 10/19/2021   AST 20 10/19/2021   ALKPHOS 68 10/19/2021   BILITOT 0.2 10/19/2021   Lab Results  Component Value Date   VD25OH 33.2 10/19/2021     Patient Active Problem List   Diagnosis Date Noted   Mixed hyperlipidemia 10/19/2021   Vitamin D deficiency 10/19/2021   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/07/2020   Essential hypertension 07/07/2020   Sciatica, right side 07/07/2020    Carpal tunnel syndrome on right 07/07/2020   Mild intermittent asthma without complication 07/07/2020    Allergies  Allergen Reactions   Morphine And Codeine Anaphylaxis   Hydrochlorothiazide Other (See Comments)    headache   Tramadol Other (See Comments)    anxiety    Past Surgical History:  Procedure Laterality Date   TOTAL ABDOMINAL HYSTERECTOMY      Social History   Tobacco Use   Smoking status: Former    Current packs/day: 0.00    Average packs/day: 0.5 packs/day for 30.0 years (15.0 ttl pk-yrs)    Types: Cigarettes    Start date: 10/29/1988    Quit date: 10/30/2018    Years since quitting: 5.1   Smokeless tobacco: Never  Vaping Use   Vaping status: Never Used  Substance Use Topics   Alcohol use: Never   Drug use: Never     Medication list has been reviewed and updated.  Current Meds  Medication Sig   albuterol (PROVENTIL) (2.5 MG/3ML) 0.083% nebulizer solution Take 3 mLs (2.5 mg total) by nebulization every 6 (six) hours as needed for wheezing or shortness of breath.   albuterol (VENTOLIN HFA) 108 (90 Base) MCG/ACT inhaler TAKE 2 PUFFS BY MOUTH EVERY 6 HOURS AS NEEDED FOR WHEEZE OR SHORTNESS OF BREATH  amoxicillin-clavulanate (AUGMENTIN) 875-125 MG tablet Take 1 tablet by mouth 2 (two) times daily for 10 days.   estradiol (ESTRACE) 2 MG tablet TAKE 1 TABLET BY MOUTH EVERY DAY   fexofenadine (ALLEGRA) 60 MG tablet Take 1 tablet (60 mg total) by mouth 2 (two) times daily.   fluticasone-salmeterol (ADVAIR) 100-50 MCG/ACT AEPB Inhale 1 puff into the lungs 2 (two) times daily.   lisinopril (ZESTRIL) 20 MG tablet TAKE 1 TABLET BY MOUTH EVERY DAY   mupirocin ointment (BACTROBAN) 2 % Apply 1 Application topically 2 (two) times daily.   Oxymetazoline HCl (MUCINEX NASAL SPRAY FULL FORCE NA) Place 0.05 sprays into the nose as needed.   predniSONE (DELTASONE) 10 MG tablet Take 6 tablets (60 mg total) by mouth daily with breakfast for 2 days, THEN 5 tablets (50 mg total)  daily with breakfast for 2 days, THEN 4 tablets (40 mg total) daily with breakfast for 2 days, THEN 3 tablets (30 mg total) daily with breakfast for 2 days, THEN 2 tablets (20 mg total) daily with breakfast for 2 days, THEN 1 tablet (10 mg total) daily with breakfast for 2 days.   promethazine-dextromethorphan (PROMETHAZINE-DM) 6.25-15 MG/5ML syrup Take 5 mLs by mouth 4 (four) times daily as needed for up to 9 days for cough.   [DISCONTINUED] ibuprofen (ADVIL) 800 MG tablet Take 1 tablet (800 mg total) by mouth every 8 (eight) hours as needed.       12/11/2023    2:32 PM 01/16/2023    4:21 PM 12/08/2022    9:10 AM 11/30/2022    4:21 PM  GAD 7 : Generalized Anxiety Score  Nervous, Anxious, on Edge 0 0 0 0  Control/stop worrying 0 0 0 0  Worry too much - different things 0 0 0 0  Trouble relaxing 0 0 0 0  Restless 0 0 0 0  Easily annoyed or irritable 0 0 0 0  Afraid - awful might happen 0 0 0 0  Total GAD 7 Score 0 0 0 0  Anxiety Difficulty Not difficult at all Not difficult at all Not difficult at all Not difficult at all       12/11/2023    2:32 PM 01/16/2023    4:21 PM 12/08/2022    9:10 AM  Depression screen PHQ 2/9  Decreased Interest 0 0 0  Down, Depressed, Hopeless 0 0 0  PHQ - 2 Score 0 0 0  Altered sleeping 0 0 0  Tired, decreased energy 0 0 0  Change in appetite 0 0 0  Feeling bad or failure about yourself  0 0 0  Trouble concentrating 0 0 0  Moving slowly or fidgety/restless 0 0 0  Suicidal thoughts 0 0 0  PHQ-9 Score 0 0 0  Difficult doing work/chores Not difficult at all Not difficult at all Not difficult at all    BP Readings from Last 3 Encounters:  12/11/23 134/84  12/08/23 118/76  04/05/23 118/78    Physical Exam Vitals and nursing note reviewed.  Constitutional:      General: She is in acute distress.     Appearance: She is well-developed.  HENT:     Head: Normocephalic and atraumatic.  Cardiovascular:     Rate and Rhythm: Normal rate and regular  rhythm.  Pulmonary:     Effort: Pulmonary effort is normal. No respiratory distress.     Breath sounds: No wheezing or rhonchi.  Musculoskeletal:     Lumbar back: Tenderness and bony tenderness  present. Negative right straight leg raise test and negative left straight leg raise test.     Right lower leg: No edema.     Left lower leg: No edema.     Comments: Esp over right SI region  Skin:    General: Skin is warm and dry.     Findings: No rash.  Neurological:     Mental Status: She is alert and oriented to person, place, and time.  Psychiatric:        Mood and Affect: Mood normal.        Behavior: Behavior normal.     Wt Readings from Last 3 Encounters:  12/11/23 180 lb (81.6 kg)  12/08/23 181 lb (82.1 kg)  04/05/23 176 lb (79.8 kg)    BP 134/84   Pulse 92   Ht 5\' 4"  (1.626 m)   Wt 180 lb (81.6 kg)   SpO2 97%   BMI 30.90 kg/m   Assessment and Plan:  Problem List Items Addressed This Visit   None Visit Diagnoses       Acute midline low back pain without sciatica       s/p fall at home continue Ibuprofen, add Flexeril continue heat or ice note for work this week   Relevant Medications   ibuprofen (ADVIL) 800 MG tablet   cyclobenzaprine (FLEXERIL) 10 MG tablet       No follow-ups on file.    Sheron Dixons, MD [facility] [place]

## 2023-12-11 NOTE — Telephone Encounter (Signed)
 Medication refill

## 2023-12-17 ENCOUNTER — Other Ambulatory Visit: Payer: Self-pay | Admitting: Internal Medicine

## 2023-12-17 DIAGNOSIS — N951 Menopausal and female climacteric states: Secondary | ICD-10-CM

## 2023-12-18 NOTE — Telephone Encounter (Signed)
 Requested Prescriptions  Pending Prescriptions Disp Refills   estradiol  (ESTRACE ) 2 MG tablet [Pharmacy Med Name: ESTRADIOL  2 MG TABLET] 90 tablet 0    Sig: TAKE 1 TABLET BY MOUTH EVERY DAY     OB/GYN:  Estrogens Failed - 12/18/2023  2:38 PM      Failed - Mammogram is up-to-date per Health Maintenance      Failed - Valid encounter within last 12 months    Recent Outpatient Visits           1 week ago Acute midline low back pain without sciatica   [place] [facility] at [facility], Chales [place], MD   1 week ago Mild intermittent acute asthmatic bronchitis with acute exacerbation   [facility] at [facility], Chales [place], MD              Passed - Last BP in normal range    BP Readings from Last 1 Encounters:  12/11/23 134/84

## 2024-01-01 ENCOUNTER — Telehealth: Payer: Self-pay

## 2024-01-01 NOTE — Telephone Encounter (Signed)
 Received FMLA paperwork on 12/29/2023. Forms completed by primary care provider on 01/01/2024.   Paperwork faxed on: 01/01/2024 PHONE #: [phone] FAX #: [phone]

## 2024-01-01 NOTE — Telephone Encounter (Signed)
 Called patient to come to the office to pick up a copy of FMLA paperwork. Informed patient that a copy of faxed over also. Patient verbalized understanding.

## 2024-01-10 ENCOUNTER — Inpatient Hospital Stay: Admission: RE | Admit: 2024-01-10 | Source: Ambulatory Visit

## 2024-03-16 ENCOUNTER — Other Ambulatory Visit: Payer: Self-pay | Admitting: Internal Medicine

## 2024-03-16 DIAGNOSIS — N951 Menopausal and female climacteric states: Secondary | ICD-10-CM

## 2024-03-19 NOTE — Telephone Encounter (Signed)
 Requested Prescriptions  Pending Prescriptions Disp Refills   estradiol  (ESTRACE ) 2 MG tablet [Pharmacy Med Name: ESTRADIOL  2 MG TABLET] 90 tablet 0    Sig: TAKE 1 TABLET BY MOUTH EVERY DAY     OB/GYN:  Estrogens Failed - 03/19/2024  9:10 AM      Failed - Mammogram is up-to-date per Health Maintenance      Passed - Last BP in normal range    BP Readings from Last 1 Encounters:  12/11/23 134/84         Passed - Valid encounter within last 12 months    Recent Outpatient Visits           3 months ago Acute midline low back pain without sciatica   [place] [facility] at [facility], Leita DEL, MD   3 months ago Mild intermittent acute asthmatic bronchitis with acute exacerbation   [facility] at [facility], Leita DEL, MD

## 2024-06-04 ENCOUNTER — Ambulatory Visit (INDEPENDENT_AMBULATORY_CARE_PROVIDER_SITE_OTHER): Admitting: Internal Medicine

## 2024-06-04 ENCOUNTER — Encounter: Payer: Self-pay | Admitting: Internal Medicine

## 2024-06-04 VITALS — BP 122/78 | HR 75 | Ht 64.0 in | Wt 173.0 lb

## 2024-06-04 DIAGNOSIS — Z23 Encounter for immunization: Secondary | ICD-10-CM | POA: Diagnosis not present

## 2024-06-04 DIAGNOSIS — Z0000 Encounter for general adult medical examination without abnormal findings: Secondary | ICD-10-CM

## 2024-06-04 DIAGNOSIS — Z131 Encounter for screening for diabetes mellitus: Secondary | ICD-10-CM

## 2024-06-04 DIAGNOSIS — I10 Essential (primary) hypertension: Secondary | ICD-10-CM | POA: Diagnosis not present

## 2024-06-04 DIAGNOSIS — M545 Low back pain, unspecified: Secondary | ICD-10-CM

## 2024-06-04 DIAGNOSIS — N951 Menopausal and female climacteric states: Secondary | ICD-10-CM

## 2024-06-04 DIAGNOSIS — J4520 Mild intermittent asthma, uncomplicated: Secondary | ICD-10-CM

## 2024-06-04 DIAGNOSIS — Z1231 Encounter for screening mammogram for malignant neoplasm of breast: Secondary | ICD-10-CM | POA: Diagnosis not present

## 2024-06-04 DIAGNOSIS — E782 Mixed hyperlipidemia: Secondary | ICD-10-CM

## 2024-06-04 MED ORDER — ESTRADIOL 2 MG PO TABS: 2.0000 mg | ORAL_TABLET | Freq: Every day | ORAL | 1 refills | Status: AC

## 2024-06-04 MED ORDER — CYCLOBENZAPRINE HCL 10 MG PO TABS: 10.0000 mg | ORAL_TABLET | Freq: Three times a day (TID) | ORAL | 0 refills | Status: AC | PRN

## 2024-06-04 MED ORDER — ALBUTEROL SULFATE HFA 108 (90 BASE) MCG/ACT IN AERS
INHALATION_SPRAY | RESPIRATORY_TRACT | 5 refills | Status: AC
Start: 2024-06-04 — End: ?

## 2024-06-04 MED ORDER — FLUTICASONE-SALMETEROL 100-50 MCG/ACT IN AEPB
1.0000 | INHALATION_SPRAY | Freq: Two times a day (BID) | RESPIRATORY_TRACT | 1 refills | Status: AC
Start: 2024-06-04 — End: ?

## 2024-06-04 MED ORDER — LISINOPRIL 20 MG PO TABS: 20.0000 mg | ORAL_TABLET | Freq: Every day | ORAL | 1 refills | Status: DC

## 2024-06-04 NOTE — Progress Notes (Signed)
 Date:  06/04/2024   Name:  Susan Durham   DOB:  03/12/1971   MRN:  [id number]   Chief Complaint: Annual Exam Susan Durham is a 53 y.o. female who presents today for her Complete Annual Exam. She feels well. She reports exercising. She reports she is sleeping fairly well. Breast complaints - none. She is planning to stop work and go to [place] for at least three months to help care for her mother with dementia.  She is requesting a 90 days supply of all her medications.  Health Maintenance  Topic Date Due   HIV Screening  Never done   DTaP/Tdap/Td vaccine (1 - Tdap) Never done   Pneumococcal Vaccine for age over 68 (1 of 2 - PCV) Never done   Hepatitis B Vaccine (1 of 3 - 19+ 3-dose series) Never done   Breast Cancer Screening  12/10/2022   COVID-19 Vaccine (6 - 2025-26 season) 04/29/2024   Cologuard (Stool DNA test)  06/29/2025   Flu Shot  Completed   Hepatitis C Screening  Completed   Zoster (Shingles) Vaccine  Completed   HPV Vaccine  Aged Out   Meningitis B Vaccine  Aged Out    Hypertension This is a chronic problem. The problem is controlled. Associated symptoms include shortness of breath. Pertinent negatives include no chest pain, headaches or palpitations. Past treatments include ACE inhibitors. The current treatment provides significant improvement. There is no history of kidney disease, CAD/MI or CVA.  Asthma She complains of cough, shortness of breath and wheezing. This is a recurrent problem. The problem occurs rarely. The cough is non-productive. Pertinent negatives include no chest pain, headaches, myalgias or trouble swallowing. Relieved by: Advair daily and prn albuterol . Her past medical history is significant for asthma.    Review of Systems  Constitutional:  Negative for fatigue and unexpected weight change.  HENT:  Negative for trouble swallowing.   Eyes:  Negative for visual disturbance.  Respiratory:  Positive for cough, shortness of breath and wheezing.  Negative for chest tightness.   Cardiovascular:  Negative for chest pain, palpitations and leg swelling.  Gastrointestinal:  Negative for abdominal pain, constipation and diarrhea.  Musculoskeletal:  Negative for arthralgias and myalgias.  Neurological:  Negative for dizziness, weakness, light-headedness and headaches.     Lab Results  Component Value Date   NA 140 10/19/2021   K 4.5 10/19/2021   CO2 23 10/19/2021   GLUCOSE 103 (H) 10/19/2021   BUN 12 10/19/2021   CREATININE 0.66 10/19/2021   CALCIUM 9.6 10/19/2021   EGFR 106 10/19/2021   GFRNONAA 75 09/17/2019   Lab Results  Component Value Date   CHOL 182 10/19/2021   HDL 37 (L) 10/19/2021   LDLCALC 88 10/19/2021   TRIG 346 (H) 10/19/2021   CHOLHDL 4.9 (H) 10/19/2021   Lab Results  Component Value Date   TSH 3.250 10/19/2021   Lab Results  Component Value Date   HGBA1C 5.2 10/19/2021   Lab Results  Component Value Date   WBC 6.0 10/19/2021   HGB 13.2 10/19/2021   HCT 40.5 10/19/2021   MCV 88 10/19/2021   PLT 279 10/19/2021   Lab Results  Component Value Date   ALT 24 10/19/2021   AST 20 10/19/2021   ALKPHOS 68 10/19/2021   BILITOT 0.2 10/19/2021   Lab Results  Component Value Date   VD25OH 33.2 10/19/2021     Patient Active Problem List   Diagnosis Date Noted   Mixed hyperlipidemia  10/19/2021   Vitamin D deficiency 10/19/2021   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/07/2020   Essential hypertension 07/07/2020   Sciatica, right side 07/07/2020   Carpal tunnel syndrome on right 07/07/2020   Mild intermittent asthma without complication 07/07/2020    Allergies  Allergen Reactions   Morphine And Codeine Anaphylaxis   Hydrochlorothiazide  Other (See Comments)    headache   Tramadol Other (See Comments)    anxiety    Past Surgical History:  Procedure Laterality Date   TOTAL ABDOMINAL HYSTERECTOMY      Social History   Tobacco Use   Smoking status: Former    Current packs/day: 0.00    Average  packs/day: 0.5 packs/day for 30.0 years (15.0 ttl pk-yrs)    Types: Cigarettes    Start date: 10/29/1988    Quit date: 10/30/2018    Years since quitting: 5.6   Smokeless tobacco: Never  Vaping Use   Vaping status: Never Used  Substance Use Topics   Alcohol use: Never   Drug use: Never     Medication list has been reviewed and updated.  Current Meds  Medication Sig   albuterol  (PROVENTIL ) (2.5 MG/3ML) 0.083% nebulizer solution Take 3 mLs (2.5 mg total) by nebulization every 6 (six) hours as needed for wheezing or shortness of breath.   fexofenadine  (ALLEGRA ) 60 MG tablet Take 1 tablet (60 mg total) by mouth 2 (two) times daily.   ibuprofen  (ADVIL ) 800 MG tablet Take 1 tablet (800 mg total) by mouth every 8 (eight) hours as needed.   mupirocin  ointment (BACTROBAN ) 2 % Apply 1 Application topically 2 (two) times daily.   Oxymetazoline HCl (MUCINEX NASAL SPRAY FULL FORCE NA) Place 0.05 sprays into the nose as needed.   [DISCONTINUED] albuterol  (VENTOLIN  HFA) 108 (90 Base) MCG/ACT inhaler TAKE 2 PUFFS BY MOUTH EVERY 6 HOURS AS NEEDED FOR WHEEZE OR SHORTNESS OF BREATH   [DISCONTINUED] cyclobenzaprine  (FLEXERIL ) 10 MG tablet Take 1 tablet (10 mg total) by mouth 3 (three) times daily as needed for muscle spasms.   [DISCONTINUED] estradiol  (ESTRACE ) 2 MG tablet TAKE 1 TABLET BY MOUTH EVERY DAY   [DISCONTINUED] fluticasone -salmeterol (ADVAIR) 100-50 MCG/ACT AEPB Inhale 1 puff into the lungs 2 (two) times daily.   [DISCONTINUED] lisinopril  (ZESTRIL ) 20 MG tablet TAKE 1 TABLET BY MOUTH EVERY DAY       06/04/2024    3:13 PM 12/11/2023    2:32 PM 01/16/2023    4:21 PM 12/08/2022    9:10 AM  GAD 7 : Generalized Anxiety Score  Nervous, Anxious, on Edge 0 0 0 0  Control/stop worrying 0 0 0 0  Worry too much - different things 0 0 0 0  Trouble relaxing 0 0 0 0  Restless 0 0 0 0  Easily annoyed or irritable 0 0 0 0  Afraid - awful might happen 0 0 0 0  Total GAD 7 Score 0 0 0 0  Anxiety Difficulty  Not difficult at all Not difficult at all Not difficult at all Not difficult at all       06/04/2024    3:13 PM 12/11/2023    2:32 PM 01/16/2023    4:21 PM  Depression screen PHQ 2/9  Decreased Interest 0 0 0  Down, Depressed, Hopeless 0 0 0  PHQ - 2 Score 0 0 0  Altered sleeping 0 0 0  Tired, decreased energy 0 0 0  Change in appetite 0 0 0  Feeling bad or failure about yourself  0  0 0  Trouble concentrating 0 0 0  Moving slowly or fidgety/restless 0 0 0  Suicidal thoughts 0 0 0  PHQ-9 Score 0 0 0  Difficult doing work/chores Not difficult at all Not difficult at all Not difficult at all    BP Readings from Last 3 Encounters:  06/04/24 122/78  12/11/23 134/84  12/08/23 118/76    Physical Exam Vitals and nursing note reviewed.  Constitutional:      General: She is not in acute distress.    Appearance: She is well-developed.  HENT:     Head: Normocephalic and atraumatic.     Right Ear: Tympanic membrane and ear canal normal.     Left Ear: Tympanic membrane and ear canal normal.     Nose:     Right Sinus: No maxillary sinus tenderness.     Left Sinus: No maxillary sinus tenderness.  Eyes:     General: No scleral icterus.       Right eye: No discharge.        Left eye: No discharge.     Conjunctiva/sclera: Conjunctivae normal.  Neck:     Thyroid: No thyromegaly.     Vascular: No carotid bruit.  Cardiovascular:     Rate and Rhythm: Normal rate and regular rhythm.     Pulses: Normal pulses.     Heart sounds: Normal heart sounds.  Pulmonary:     Effort: Pulmonary effort is normal. No respiratory distress.     Breath sounds: No wheezing.  Abdominal:     General: Bowel sounds are normal.     Palpations: Abdomen is soft.     Tenderness: There is no abdominal tenderness.  Musculoskeletal:     Cervical back: Normal range of motion. No erythema.     Right lower leg: No edema.     Left lower leg: No edema.  Lymphadenopathy:     Cervical: No cervical adenopathy.   Skin:    General: Skin is warm and dry.     Findings: No rash.  Neurological:     Mental Status: She is alert and oriented to person, place, and time.     Cranial Nerves: No cranial nerve deficit.     Sensory: No sensory deficit.     Deep Tendon Reflexes: Reflexes are normal and symmetric.  Psychiatric:        Attention and Perception: Attention normal.        Mood and Affect: Mood normal.     Wt Readings from Last 3 Encounters:  06/04/24 173 lb (78.5 kg)  12/11/23 180 lb (81.6 kg)  12/08/23 181 lb (82.1 kg)    BP 122/78   Pulse 75   Ht 5' 4 (1.626 m)   Wt 173 lb (78.5 kg)   SpO2 97%   BMI 29.70 kg/m   Assessment and Plan:  Problem List Items Addressed This Visit       Unprioritized   Essential hypertension (Chronic)   Relevant Medications   lisinopril  (ZESTRIL ) 20 MG tablet   Other Relevant Orders   CBC with Differential/Platelet   Comprehensive metabolic panel with GFR   TSH   Urinalysis, Routine w reflex microscopic   Mild intermittent asthma without complication (Chronic)   Relevant Medications   albuterol  (VENTOLIN  HFA) 108 (90 Base) MCG/ACT inhaler   fluticasone -salmeterol (ADVAIR) 100-50 MCG/ACT AEPB   Mixed hyperlipidemia (Chronic)   Relevant Medications   lisinopril  (ZESTRIL ) 20 MG tablet   Other Relevant Orders   Lipid panel  Other Visit Diagnoses       Annual physical exam    -  Primary   Relevant Orders   CBC with Differential/Platelet   Comprehensive metabolic panel with GFR   Lipid panel   TSH   Urinalysis, Routine w reflex microscopic   Hemoglobin A1c     Encounter for screening mammogram for breast cancer       schedule mammogram at [facility] before leaving for [place]     Menopause syndrome       Relevant Medications   estradiol  (ESTRACE ) 2 MG tablet     Acute midline low back pain without sciatica       s/p fall at home continue Ibuprofen , add Flexeril  continue heat or ice note for work this week   Relevant Medications    cyclobenzaprine  (FLEXERIL ) 10 MG tablet     Screening for diabetes mellitus       Relevant Orders   Hemoglobin A1c     Encounter for immunization       Relevant Orders   Flu vaccine trivalent PF, 6mos and older(Flulaval,Afluria,Fluarix,Fluzone) (Completed)       No follow-ups on file.    Leita HILARIO Adie, MD [facility] [place]

## 2024-06-04 NOTE — Patient Instructions (Signed)
 Call [facility] to schedule your mammogram at [phone].

## 2024-06-05 ENCOUNTER — Other Ambulatory Visit: Payer: Self-pay

## 2024-06-05 ENCOUNTER — Ambulatory Visit: Payer: Self-pay | Admitting: Internal Medicine

## 2024-06-05 DIAGNOSIS — I10 Essential (primary) hypertension: Secondary | ICD-10-CM

## 2024-06-05 LAB — COMPREHENSIVE METABOLIC PANEL WITH GFR
ALT: 31 IU/L (ref 0–32)
AST: 32 IU/L (ref 0–40)
Albumin: 4.3 g/dL (ref 3.8–4.9)
Alkaline Phosphatase: 52 IU/L (ref 49–135)
BUN/Creatinine Ratio: 17 (ref 9–23)
BUN: 11 mg/dL (ref 6–24)
Bilirubin Total: <0.2 mg/dL (ref 0.0–1.2)
CO2: 21 mmol/L (ref 20–29)
Calcium: 9.4 mg/dL (ref 8.7–10.2)
Chloride: 104 mmol/L (ref 96–106)
Creatinine, Ser: 0.66 mg/dL (ref 0.57–1.00)
Globulin, Total: 3.1 g/dL (ref 1.5–4.5)
Glucose: 88 mg/dL (ref 70–99)
Potassium: 4.3 mmol/L (ref 3.5–5.2)
Sodium: 139 mmol/L (ref 134–144)
Total Protein: 7.4 g/dL (ref 6.0–8.5)
eGFR: 105 mL/min/1.73 (ref >59)

## 2024-06-05 LAB — CBC WITH DIFFERENTIAL/PLATELET
Basophils Absolute: 0 x10E3/uL (ref 0.0–0.2)
Basos: 1 %
EOS (ABSOLUTE): 0.3 x10E3/uL (ref 0.0–0.4)
Eos: 4 %
Hematocrit: 39.2 % (ref 34.0–46.6)
Hemoglobin: 12.7 g/dL (ref 11.1–15.9)
Immature Grans (Abs): 0 x10E3/uL (ref 0.0–0.1)
Immature Granulocytes: 0 %
Lymphocytes Absolute: 2.5 x10E3/uL (ref 0.7–3.1)
Lymphs: 30 %
MCH: 28.9 pg (ref 26.6–33.0)
MCHC: 32.4 g/dL (ref 31.5–35.7)
MCV: 89 fL (ref 79–97)
Monocytes Absolute: 0.8 x10E3/uL (ref 0.1–0.9)
Monocytes: 10 %
Neutrophils Absolute: 4.5 x10E3/uL (ref 1.4–7.0)
Neutrophils: 55 %
Platelets: 281 x10E3/uL (ref 150–450)
RBC: 4.39 x10E6/uL (ref 3.77–5.28)
RDW: 11.7 % (ref 11.7–15.4)
WBC: 8.2 x10E3/uL (ref 3.4–10.8)

## 2024-06-05 LAB — HEMOGLOBIN A1C
Est. average glucose Bld gHb Est-mCnc: 111 mg/dL
Hgb A1c MFr Bld: 5.5 % (ref 4.8–5.6)

## 2024-06-05 LAB — URINALYSIS, ROUTINE W REFLEX MICROSCOPIC
Bilirubin, UA: NEGATIVE
Glucose, UA: NEGATIVE
Ketones, UA: NEGATIVE
Leukocytes,UA: NEGATIVE
Nitrite, UA: NEGATIVE
Protein,UA: NEGATIVE
RBC, UA: NEGATIVE
Specific Gravity, UA: 1.023 (ref 1.005–1.030)
Urobilinogen, Ur: 1 mg/dL (ref 0.2–1.0)
pH, UA: 5.5 (ref 5.0–7.5)

## 2024-06-05 LAB — LIPID PANEL
Chol/HDL Ratio: 4.7 ratio — ABNORMAL HIGH (ref 0.0–4.4)
Cholesterol, Total: 160 mg/dL (ref 100–199)
HDL: 34 mg/dL — ABNORMAL LOW (ref >39)
LDL Chol Calc (NIH): 84 mg/dL (ref 0–99)
Triglycerides: 253 mg/dL — ABNORMAL HIGH (ref 0–149)
VLDL Cholesterol Cal: 42 mg/dL — ABNORMAL HIGH (ref 5–40)

## 2024-06-05 LAB — TSH: TSH: 3.46 u[IU]/mL (ref 0.450–4.500)

## 2024-06-05 MED ORDER — LISINOPRIL 20 MG PO TABS: 20.0000 mg | ORAL_TABLET | Freq: Every day | ORAL | 1 refills | Status: AC
